# Patient Record
Sex: Male | Born: 1960 | Race: White | Hispanic: No | Marital: Married | Smoking: Current some day smoker
Health system: Southern US, Community
[De-identification: ages and names within clinical notes are randomized; demographics above are authoritative.]

## PROBLEM LIST (undated history)

## (undated) DIAGNOSIS — I2510 Atherosclerotic heart disease of native coronary artery without angina pectoris: Secondary | ICD-10-CM

## (undated) DIAGNOSIS — I10 Essential (primary) hypertension: Secondary | ICD-10-CM

## (undated) DIAGNOSIS — Z720 Tobacco use: Secondary | ICD-10-CM

## (undated) DIAGNOSIS — R001 Bradycardia, unspecified: Secondary | ICD-10-CM

## (undated) DIAGNOSIS — M549 Dorsalgia, unspecified: Secondary | ICD-10-CM

## (undated) DIAGNOSIS — I214 Non-ST elevation (NSTEMI) myocardial infarction: Secondary | ICD-10-CM

## (undated) DIAGNOSIS — E785 Hyperlipidemia, unspecified: Secondary | ICD-10-CM

## (undated) HISTORY — DX: Bradycardia, unspecified: R00.1

## (undated) HISTORY — DX: Tobacco use: Z72.0

## (undated) HISTORY — PX: PARTIAL GASTRECTOMY: SHX2172

## (undated) HISTORY — PX: BACK SURGERY: SHX140

## (undated) HISTORY — DX: Atherosclerotic heart disease of native coronary artery without angina pectoris: I25.10

---

## 2017-05-21 ENCOUNTER — Emergency Department (HOSPITAL_COMMUNITY): Payer: 59

## 2017-05-21 ENCOUNTER — Encounter (HOSPITAL_COMMUNITY): Payer: Self-pay | Admitting: Emergency Medicine

## 2017-05-21 ENCOUNTER — Inpatient Hospital Stay (HOSPITAL_COMMUNITY)
Admission: EM | Admit: 2017-05-21 | Discharge: 2017-05-22 | DRG: 247 | Disposition: A | Discharge status: Home / Self Care | Payer: 59 | Attending: Internal Medicine | Admitting: Internal Medicine

## 2017-05-21 ENCOUNTER — Encounter (HOSPITAL_COMMUNITY)
Admission: EM | Disposition: A | Discharge status: Home / Self Care | Payer: Self-pay | Source: Home / Self Care | Attending: Internal Medicine

## 2017-05-21 DIAGNOSIS — I2510 Atherosclerotic heart disease of native coronary artery without angina pectoris: Secondary | ICD-10-CM

## 2017-05-21 DIAGNOSIS — Z955 Presence of coronary angioplasty implant and graft: Secondary | ICD-10-CM

## 2017-05-21 DIAGNOSIS — F17210 Nicotine dependence, cigarettes, uncomplicated: Secondary | ICD-10-CM | POA: Diagnosis present

## 2017-05-21 DIAGNOSIS — Z720 Tobacco use: Secondary | ICD-10-CM | POA: Diagnosis not present

## 2017-05-21 DIAGNOSIS — I10 Essential (primary) hypertension: Secondary | ICD-10-CM

## 2017-05-21 DIAGNOSIS — I209 Angina pectoris, unspecified: Secondary | ICD-10-CM | POA: Diagnosis present

## 2017-05-21 DIAGNOSIS — E782 Mixed hyperlipidemia: Secondary | ICD-10-CM | POA: Diagnosis not present

## 2017-05-21 DIAGNOSIS — I214 Non-ST elevation (NSTEMI) myocardial infarction: Principal | ICD-10-CM

## 2017-05-21 DIAGNOSIS — E785 Hyperlipidemia, unspecified: Secondary | ICD-10-CM | POA: Diagnosis present

## 2017-05-21 DIAGNOSIS — M546 Pain in thoracic spine: Secondary | ICD-10-CM | POA: Diagnosis present

## 2017-05-21 HISTORY — DX: Non-ST elevation (NSTEMI) myocardial infarction: I21.4

## 2017-05-21 HISTORY — DX: Essential (primary) hypertension: I10

## 2017-05-21 HISTORY — PX: CORONARY BALLOON ANGIOPLASTY: CATH118233

## 2017-05-21 HISTORY — DX: Hyperlipidemia, unspecified: E78.5

## 2017-05-21 HISTORY — DX: Dorsalgia, unspecified: M54.9

## 2017-05-21 HISTORY — PX: LEFT HEART CATH AND CORONARY ANGIOGRAPHY: CATH118249

## 2017-05-21 HISTORY — PX: CORONARY STENT INTERVENTION: CATH118234

## 2017-05-21 LAB — TROPONIN I
TROPONIN I: 5.37 ng/mL — AB (ref <0.03)
Troponin I: 4.32 ng/mL (ref <0.03)

## 2017-05-21 LAB — BASIC METABOLIC PANEL
Anion gap: 9 (ref 5–15)
BUN: 8 mg/dL (ref 6–20)
CHLORIDE: 106 mmol/L (ref 101–111)
CO2: 23 mmol/L (ref 22–32)
Calcium: 9.2 mg/dL (ref 8.9–10.3)
Creatinine, Ser: 0.94 mg/dL (ref 0.61–1.24)
GFR calc non Af Amer: >60 mL/min (ref >60)
Glucose, Bld: 103 mg/dL — ABNORMAL HIGH (ref 65–99)
POTASSIUM: 3.3 mmol/L — AB (ref 3.5–5.1)
SODIUM: 138 mmol/L (ref 135–145)

## 2017-05-21 LAB — CBC
HEMATOCRIT: 41.3 % (ref 39.0–52.0)
Hemoglobin: 14.3 g/dL (ref 13.0–17.0)
MCH: 31.4 pg (ref 26.0–34.0)
MCHC: 34.6 g/dL (ref 30.0–36.0)
MCV: 90.8 fL (ref 78.0–100.0)
Platelets: 209 10*3/uL (ref 150–400)
RBC: 4.55 MIL/uL (ref 4.22–5.81)
RDW: 13.1 % (ref 11.5–15.5)
WBC: 10.3 10*3/uL (ref 4.0–10.5)

## 2017-05-21 LAB — I-STAT TROPONIN, ED: Troponin i, poc: 0.16 ng/mL (ref 0.00–0.08)

## 2017-05-21 LAB — TYPE AND SCREEN
ABO/RH(D): O NEG
Antibody Screen: NEGATIVE

## 2017-05-21 LAB — PROTIME-INR
INR: 0.95
PROTHROMBIN TIME: 12.6 s (ref 11.4–15.2)

## 2017-05-21 LAB — ABO/RH: ABO/RH(D): O NEG

## 2017-05-21 LAB — APTT: aPTT: 30 seconds (ref 24–36)

## 2017-05-21 LAB — HEPARIN LEVEL (UNFRACTIONATED): Heparin Unfractionated: 0.16 IU/mL — ABNORMAL LOW (ref 0.30–0.70)

## 2017-05-21 LAB — MRSA PCR SCREENING: MRSA BY PCR: NEGATIVE

## 2017-05-21 SURGERY — LEFT HEART CATH AND CORONARY ANGIOGRAPHY
Anesthesia: LOCAL

## 2017-05-21 MED ORDER — HEPARIN BOLUS VIA INFUSION
4000.0000 [IU] | Freq: Once | INTRAVENOUS | Status: AC
  Administered 2017-05-21: 4000 [IU] via INTRAVENOUS
  Filled 2017-05-21: qty 4000

## 2017-05-21 MED ORDER — IOPAMIDOL (ISOVUE-370) INJECTION 76%
INTRAVENOUS | Status: DC | PRN
  Administered 2017-05-21: 215 mL via INTRA_ARTERIAL

## 2017-05-21 MED ORDER — TIROFIBAN (AGGRASTAT) BOLUS VIA INFUSION
INTRAVENOUS | Status: DC | PRN
  Administered 2017-05-21: 2080 ug via INTRAVENOUS

## 2017-05-21 MED ORDER — TICAGRELOR 90 MG PO TABS
ORAL_TABLET | ORAL | Status: DC | PRN
  Administered 2017-05-21: 180 mg via ORAL

## 2017-05-21 MED ORDER — NITROGLYCERIN 1 MG/10 ML FOR IR/CATH LAB
INTRA_ARTERIAL | Status: DC | PRN
  Administered 2017-05-21: 400 ug via INTRA_ARTERIAL
  Administered 2017-05-21: 200 ug via INTRACORONARY

## 2017-05-21 MED ORDER — VERAPAMIL HCL 2.5 MG/ML IV SOLN
INTRAVENOUS | Status: AC
  Filled 2017-05-21: qty 2

## 2017-05-21 MED ORDER — SODIUM CHLORIDE 0.9 % WEIGHT BASED INFUSION: 3.0000 mL/kg/h | INTRAVENOUS | Status: DC

## 2017-05-21 MED ORDER — IOPAMIDOL (ISOVUE-370) INJECTION 76%
INTRAVENOUS | Status: AC
  Administered 2017-05-21: 100 mL
  Filled 2017-05-21: qty 100

## 2017-05-21 MED ORDER — LABETALOL HCL 5 MG/ML IV SOLN: 10.0000 mg | INTRAVENOUS | Status: AC | PRN

## 2017-05-21 MED ORDER — METOPROLOL TARTRATE 12.5 MG HALF TABLET
12.5000 mg | ORAL_TABLET | Freq: Two times a day (BID) | ORAL | Status: DC
  Administered 2017-05-21 – 2017-05-22 (×3): 12.5 mg via ORAL
  Filled 2017-05-21 (×3): qty 1

## 2017-05-21 MED ORDER — TICAGRELOR 90 MG PO TABS
90.0000 mg | ORAL_TABLET | Freq: Two times a day (BID) | ORAL | Status: DC
  Administered 2017-05-22 (×2): 90 mg via ORAL
  Filled 2017-05-21 (×2): qty 1

## 2017-05-21 MED ORDER — ATORVASTATIN CALCIUM 80 MG PO TABS
80.0000 mg | ORAL_TABLET | Freq: Every day | ORAL | Status: DC
  Administered 2017-05-21: 80 mg via ORAL
  Filled 2017-05-21: qty 1

## 2017-05-21 MED ORDER — ACETAMINOPHEN 325 MG PO TABS: 650.0000 mg | ORAL_TABLET | ORAL | Status: DC | PRN

## 2017-05-21 MED ORDER — SODIUM CHLORIDE 0.9% FLUSH: 3.0000 mL | Freq: Two times a day (BID) | INTRAVENOUS | Status: DC

## 2017-05-21 MED ORDER — ASPIRIN 81 MG PO CHEW
324.0000 mg | CHEWABLE_TABLET | Freq: Once | ORAL | Status: AC
  Administered 2017-05-21: 324 mg via ORAL
  Filled 2017-05-21: qty 4

## 2017-05-21 MED ORDER — HEPARIN SODIUM (PORCINE) 1000 UNIT/ML IJ SOLN
INTRAMUSCULAR | Status: DC | PRN
  Administered 2017-05-21: 7000 [IU] via INTRAVENOUS
  Administered 2017-05-21: 3000 [IU] via INTRAVENOUS

## 2017-05-21 MED ORDER — SODIUM CHLORIDE 0.9 % IV SOLN
INTRAVENOUS | Status: AC
  Administered 2017-05-21: 16:00:00 via INTRAVENOUS

## 2017-05-21 MED ORDER — TIROFIBAN HCL IN NACL 5-0.9 MG/100ML-% IV SOLN
INTRAVENOUS | Status: AC
  Filled 2017-05-21: qty 100

## 2017-05-21 MED ORDER — LIDOCAINE HCL (PF) 1 % IJ SOLN
INTRAMUSCULAR | Status: DC | PRN
  Administered 2017-05-21: 2 mL

## 2017-05-21 MED ORDER — IOPAMIDOL (ISOVUE-370) INJECTION 76%
INTRAVENOUS | Status: AC
  Filled 2017-05-21: qty 100

## 2017-05-21 MED ORDER — MIDAZOLAM HCL 2 MG/2ML IJ SOLN
INTRAMUSCULAR | Status: DC | PRN
  Administered 2017-05-21: 1 mg
  Administered 2017-05-21: 2 mg via INTRAVENOUS

## 2017-05-21 MED ORDER — SODIUM CHLORIDE 0.9 % IV SOLN: 250.0000 mL | INTRAVENOUS | Status: DC | PRN

## 2017-05-21 MED ORDER — SODIUM CHLORIDE 0.9% FLUSH: 3.0000 mL | INTRAVENOUS | Status: DC | PRN

## 2017-05-21 MED ORDER — IOPAMIDOL (ISOVUE-370) INJECTION 76%
INTRAVENOUS | Status: AC
  Filled 2017-05-21: qty 125

## 2017-05-21 MED ORDER — SODIUM CHLORIDE 0.9 % WEIGHT BASED INFUSION: 1.0000 mL/kg/h | INTRAVENOUS | Status: DC

## 2017-05-21 MED ORDER — MIDAZOLAM HCL 2 MG/2ML IJ SOLN
INTRAMUSCULAR | Status: AC
  Filled 2017-05-21: qty 2

## 2017-05-21 MED ORDER — IOPAMIDOL (ISOVUE-370) INJECTION 76%
INTRAVENOUS | Status: AC
  Filled 2017-05-21: qty 50

## 2017-05-21 MED ORDER — HEPARIN (PORCINE) IN NACL 2-0.9 UNIT/ML-% IJ SOLN
INTRAMUSCULAR | Status: AC | PRN
  Administered 2017-05-21: 1000 mL

## 2017-05-21 MED ORDER — FENTANYL CITRATE (PF) 100 MCG/2ML IJ SOLN
INTRAMUSCULAR | Status: AC
  Filled 2017-05-21: qty 2

## 2017-05-21 MED ORDER — HYDRALAZINE HCL 20 MG/ML IJ SOLN: 5.0000 mg | INTRAMUSCULAR | Status: AC | PRN

## 2017-05-21 MED ORDER — ASPIRIN 81 MG PO CHEW: 81.0000 mg | CHEWABLE_TABLET | ORAL | Status: DC

## 2017-05-21 MED ORDER — NITROGLYCERIN IN D5W 200-5 MCG/ML-% IV SOLN: 15.0000 ug/min | INTRAVENOUS | Status: DC

## 2017-05-21 MED ORDER — TIROFIBAN HCL IN NACL 5-0.9 MG/100ML-% IV SOLN: 0.1500 ug/kg/min | INTRAVENOUS | Status: DC

## 2017-05-21 MED ORDER — TIROFIBAN HCL IV 12.5 MG/250 ML
INTRAVENOUS | Status: DC | PRN
  Administered 2017-05-21: .15 ug/kg/min via INTRAVENOUS

## 2017-05-21 MED ORDER — ASPIRIN 81 MG PO CHEW
81.0000 mg | CHEWABLE_TABLET | Freq: Every day | ORAL | Status: DC
  Administered 2017-05-21 – 2017-05-22 (×2): 81 mg via ORAL
  Filled 2017-05-21 (×2): qty 1

## 2017-05-21 MED ORDER — ONDANSETRON HCL 4 MG/2ML IJ SOLN: 4.0000 mg | Freq: Four times a day (QID) | INTRAMUSCULAR | Status: DC | PRN

## 2017-05-21 MED ORDER — SODIUM CHLORIDE 0.9% FLUSH
3.0000 mL | Freq: Two times a day (BID) | INTRAVENOUS | Status: DC
  Administered 2017-05-22: 3 mL via INTRAVENOUS

## 2017-05-21 MED ORDER — FENTANYL CITRATE (PF) 100 MCG/2ML IJ SOLN
INTRAMUSCULAR | Status: DC | PRN
  Administered 2017-05-21: 25 ug via INTRAVENOUS

## 2017-05-21 MED ORDER — HEPARIN (PORCINE) IN NACL 100-0.45 UNIT/ML-% IJ SOLN
1000.0000 [IU]/h | INTRAMUSCULAR | Status: DC
  Administered 2017-05-21: 1000 [IU]/h via INTRAVENOUS
  Filled 2017-05-21: qty 250

## 2017-05-21 MED ORDER — NITROGLYCERIN IN D5W 200-5 MCG/ML-% IV SOLN
0.0000 ug/min | Freq: Once | INTRAVENOUS | Status: AC
  Administered 2017-05-21: 5 ug/min via INTRAVENOUS
  Filled 2017-05-21: qty 250

## 2017-05-21 MED ORDER — TICAGRELOR 90 MG PO TABS
ORAL_TABLET | ORAL | Status: AC
  Filled 2017-05-21: qty 2

## 2017-05-21 MED ORDER — HEPARIN SODIUM (PORCINE) 1000 UNIT/ML IJ SOLN
INTRAMUSCULAR | Status: AC
  Filled 2017-05-21: qty 1

## 2017-05-21 MED ORDER — ASPIRIN EC 81 MG PO TBEC: 81.0000 mg | DELAYED_RELEASE_TABLET | Freq: Every day | ORAL | Status: DC

## 2017-05-21 MED ORDER — NITROGLYCERIN 1 MG/10 ML FOR IR/CATH LAB
INTRA_ARTERIAL | Status: AC
  Filled 2017-05-21: qty 10

## 2017-05-21 MED ORDER — SODIUM CHLORIDE 0.9 % WEIGHT BASED INFUSION
3.0000 mL/kg/h | INTRAVENOUS | Status: DC
  Administered 2017-05-21: 3 mL/kg/h via INTRAVENOUS

## 2017-05-21 MED ORDER — NITROGLYCERIN 0.4 MG SL SUBL
0.4000 mg | SUBLINGUAL_TABLET | SUBLINGUAL | Status: AC | PRN
  Administered 2017-05-21 (×3): 0.4 mg via SUBLINGUAL
  Filled 2017-05-21 (×2): qty 1

## 2017-05-21 MED ORDER — VERAPAMIL HCL 2.5 MG/ML IV SOLN
INTRAVENOUS | Status: DC | PRN
  Administered 2017-05-21: 10 mL via INTRA_ARTERIAL

## 2017-05-21 SURGICAL SUPPLY — 21 items
BALLN EMERGE MR 2.5X20 (BALLOONS) ×2
BALLN ~~LOC~~ EMERGE MR 3.75X8 (BALLOONS) ×2
BALLOON EMERGE MR 2.5X20 (BALLOONS) ×1 IMPLANT
BALLOON ~~LOC~~ EMERGE MR 3.75X8 (BALLOONS) ×1 IMPLANT
CATH 5FR JL3.5 JR4 ANG PIG MP (CATHETERS) ×2 IMPLANT
CATH LAUNCHER 6FR EBU 3 (CATHETERS) ×2 IMPLANT
DEVICE RAD COMP TR BAND LRG (VASCULAR PRODUCTS) ×4 IMPLANT
ELECT DEFIB PAD ADLT CADENCE (PAD) ×2 IMPLANT
GLIDESHEATH SLEND SS 6F .021 (SHEATH) ×2 IMPLANT
GUIDEWIRE INQWIRE 1.5J.035X260 (WIRE) ×1 IMPLANT
INQWIRE 1.5J .035X260CM (WIRE) ×2
KIT ENCORE 26 ADVANTAGE (KITS) ×2 IMPLANT
KIT HEART LEFT (KITS) ×2 IMPLANT
PACK CARDIAC CATHETERIZATION (CUSTOM PROCEDURE TRAY) ×2 IMPLANT
STENT SYNERGY DES 3.5X16 (Permanent Stent) ×2 IMPLANT
STENT SYNERGY DES 3.5X20 (Permanent Stent) ×2 IMPLANT
TRANSDUCER W/STOPCOCK (MISCELLANEOUS) ×2 IMPLANT
TUBING CIL FLEX 10 FLL-RA (TUBING) ×2 IMPLANT
VALVE GUARDIAN II ~~LOC~~ HEMO (MISCELLANEOUS) ×2 IMPLANT
WIRE ASAHI PROWATER 180CM (WIRE) ×2 IMPLANT
WIRE HI TORQ BMW 190CM (WIRE) ×2 IMPLANT

## 2017-05-21 NOTE — ED Notes (Signed)
Patient transported to CT scan . 

## 2017-05-21 NOTE — Progress Notes (Addendum)
    7:53 am  Re-evaluated patient. He is resting comfortably and only having some mild chest soreness at this time after NTG drip up titrated  To 15 mcg. Plan to go get him into a room and cycle troponins. He will need cardiac catheterization either today or Monday. Initial troponin is 0.16 at 4 am.  -- Cycle troponins, daily weights, strict in/outs  EKG shows NSR, incomplete RBBB HR 75 No old for comparison.

## 2017-05-21 NOTE — ED Provider Notes (Signed)
[facility] DEPT Provider Note   CSN: [id number] Arrival date & time: 05/21/17  9892     History   Chief Complaint Chief Complaint  Patient presents with  . Chest Pain  . Back Pain    HPI Lucas Norton is a 56 y.o. male.  The history is provided by the patient and the spouse.  Chest Pain   This is a recurrent problem. The current episode started 3 to 5 hours ago. The problem occurs constantly. The problem has been rapidly worsening. The pain is severe. The quality of the pain is described as pressure-like. The pain radiates to the upper back. Associated symptoms include back pain, numbness and shortness of breath. Pertinent negatives include no fever. He has tried nothing for the symptoms. Risk factors include smoking/tobacco exposure and male gender.  His past medical history is significant for hyperlipidemia and hypertension.  Pertinent negatives for past medical history include no CAD and no PE.  Back Pain   Associated symptoms include chest pain and numbness. Pertinent negatives include no fever.  patient with h/o HTN/HLP presents with pain He reports about 3 hrs ago he had onset of upper back pain that radiates into chest  He report SOB He reports "tingling" in his left arm No HA No abd pain No focal weakness He reports similar pain before but this is worse, and no recent medical evaluations  PMH - HTN/HLP Soc hx - smoker, just moved here from [place]  Past Medical History:  Diagnosis Date  . Back pain   . Hypercholesteremia   . Hypertension     There are no active problems to display for this patient.   Past Surgical History:  Procedure Laterality Date  . BACK SURGERY    . PARTIAL GASTRECTOMY         Home Medications    Prior to Admission medications   Not on File    Family History History reviewed. No pertinent family history.  Social History Social History  Substance Use Topics  . Smoking status: Current Every Day Smoker   Packs/day: 1.00    Types: Cigarettes  . Smokeless tobacco: Not on file  . Alcohol use Yes     Comment: socially     Allergies   Patient has no known allergies.   Review of Systems Review of Systems  Constitutional: Negative for fever.  Respiratory: Positive for shortness of breath.   Cardiovascular: Positive for chest pain.  Musculoskeletal: Positive for back pain.  Neurological: Positive for numbness.  All other systems reviewed and are negative.    Physical Exam Updated Vital Signs BP (!) 146/101 (BP Location: Right Arm)   Pulse 73   Temp 98.1 F (36.7 C) (Oral)   Resp 20   Ht 1.778 m (5\' 10" )   Wt 86.2 kg (190 lb)   SpO2 98%   BMI 27.26 kg/m   Physical Exam CONSTITUTIONAL: Well developed/well nourished, anxious, pacing around room HEAD: Normocephalic/atraumatic EYES: EOMI/PERRL ENMT: Mucous membranes moist NECK: supple no meningeal signs SPINE/BACK:entire spine nontender CV: S1/S2 noted, no murmurs/rubs/gallops noted LUNGS: Lungs are clear to auscultation bilaterally, no apparent distress ABDOMEN: soft, nontender, no rebound or guarding, bowel sounds noted throughout abdomen GU:no cva tenderness NEURO: Pt is awake/alert/appropriate, moves all extremitiesx4.  No facial droop.  No arm/leg drift noted EXTREMITIES: pulses normal/equal, full ROM SKIN: warm, color normal PSYCH: anxious  ED Treatments / Results  Labs (all labs ordered are listed, but only abnormal results are displayed) Labs Reviewed  BASIC  METABOLIC PANEL - Abnormal; Notable for the following:       Result Value   Potassium 3.3 (*)    Glucose, Bld 103 (*)    All other components within normal limits  I-STAT TROPONIN, ED - Abnormal; Notable for the following:    Troponin i, poc 0.16 (*)    All other components within normal limits  CBC  PROTIME-INR  APTT  HEPARIN LEVEL (UNFRACTIONATED)  TYPE AND SCREEN  ABO/RH    EKG  EKG Interpretation  Date/Time:  Sunday May 21 2017 03:30:25  EDT Ventricular Rate:  80 PR Interval:  152 QRS Duration: 112 QT Interval:  376 QTC Calculation: 433 R Axis:   94 Text Interpretation:  Normal sinus rhythm Rightward axis Incomplete right bundle branch block Cannot rule out Inferior infarct , age undetermined Abnormal ECG No previous ECGs available Confirmed by Ripley Fraise [phone]) on 05/21/2017 4:14:06 AM       Radiology Dg Chest 2 View  Result Date: 05/21/2017 CLINICAL DATA:  Chest and back pain for 3 hours. Hypertension. Smoker. EXAM: CHEST  2 VIEW COMPARISON:  None. FINDINGS: Normal heart size and pulmonary vascularity. Mediastinal contours are intact. Calcified and tortuous aorta. Vague rounded density over the right upper chest probably represents an EKG lead. No airspace disease or consolidation in the lungs. Calcified granulomas in the right lung. No blunting of costophrenic angles. No pneumothorax. Mild degenerative changes in the spine. IMPRESSION: No active cardiopulmonary disease. Electronically Signed   By: Lucienne Capers M.D.   On: 05/21/2017 04:00   Ct Angio Chest/abd/pel For Dissection W And/or Wo Contrast  Result Date: 05/21/2017 CLINICAL DATA:  Midchest pain radiating to the back for 3 hours. EXAM: CT ANGIOGRAPHY CHEST, ABDOMEN AND PELVIS TECHNIQUE: Multidetector CT imaging through the chest, abdomen and pelvis was performed using the standard protocol during bolus administration of intravenous contrast. Multiplanar reconstructed images and MIPs were obtained and reviewed to evaluate the vascular anatomy. CONTRAST:  100 mL Isovue 370 COMPARISON:  None. FINDINGS: CTA CHEST FINDINGS Cardiovascular: Preferential opacification of the thoracic aorta. No evidence of thoracic aortic aneurysm or dissection. Normal heart size. No pericardial effusion. The pulmonary arteries are also well opacified, with no emboli. Mediastinum/Nodes: No enlarged mediastinal, hilar, or axillary lymph nodes. Thyroid gland, trachea, and esophagus  demonstrate no significant findings. Lungs/Pleura: Lungs are clear. No pleural effusion or pneumothorax. Incidental calcified granulomata. Musculoskeletal: No significant skeletal lesions. Review of the MIP images confirms the above findings. CTA ABDOMEN AND PELVIS FINDINGS VASCULAR Aorta: Normal caliber aorta without aneurysm, dissection, vasculitis or significant stenosis. Moderate atherosclerotic calcifications and nonstenotic plaque. Celiac: Patent without evidence of aneurysm, dissection, vasculitis or significant stenosis. SMA: Patent without evidence of aneurysm, dissection, vasculitis or significant stenosis. Renals: Both renal arteries are patent without evidence of aneurysm, dissection, vasculitis, fibromuscular dysplasia or significant stenosis. IMA: Patent without evidence of aneurysm, dissection, vasculitis or significant stenosis. Inflow: Patent without evidence of aneurysm, dissection, vasculitis or significant stenosis. Veins: No obvious venous abnormality within the limitations of this arterial phase study. Review of the MIP images confirms the above findings. NON-VASCULAR Hepatobiliary: No focal liver abnormality is seen. No gallstones, gallbladder wall thickening, or biliary dilatation. Pancreas: Unremarkable. No pancreatic ductal dilatation or surrounding inflammatory changes. Spleen: Normal in size without focal abnormality. Adrenals/Urinary Tract: Adrenal glands are unremarkable. Kidneys are normal, without renal calculi, focal lesion, or hydronephrosis. Bladder is unremarkable. Stomach/Bowel: Stomach has been partially resected, probably with gastrojejunostomy. Small bowel is otherwise unremarkable. Appendix is normal. No  evidence of bowel wall thickening, distention, or inflammatory changes. Lymphatic: No adenopathy in the abdomen or pelvis. Reproductive: Unremarkable Other: No focal inflammation.  No ascites. Musculoskeletal: No significant skeletal lesion. Moderately severe lumbar  degenerative disc changes at L3-4 and L4-5. Review of the MIP images confirms the above findings. IMPRESSION: 1. Aortic atherosclerosis.  No evidence of acute aortic syndrome. 2. No acute findings are evident in the chest, abdomen or pelvis. Electronically Signed   By: Andreas Newport M.D.   On: 05/21/2017 05:15    Procedures Procedures  CRITICAL CARE Performed by: Sharyon Cable Total critical care time: 40 minutes Critical care time was exclusive of separately billable procedures and treating other patients. Critical care was necessary to treat or prevent imminent or life-threatening deterioration. Critical care was time spent personally by me on the following activities: development of treatment plan with patient and/or surrogate as well as nursing, discussions with consultants, evaluation of patient's response to treatment, examination of patient, obtaining history from patient or surrogate, ordering and performing treatments and interventions, ordering and review of laboratory studies, ordering and review of radiographic studies, pulse oximetry and re-evaluation of patient's condition. PATIENT WITH NON-STEMI, REQUIRING IV NITRO AND IV HEPARIN DRIP AND ADMISSION  Medications Ordered in ED Medications  heparin ADULT infusion 100 units/mL (25000 units/266mL sodium chloride 0.45%) (1,000 Units/hr Intravenous New Bag/Given 05/21/17 0623)  nitroGLYCERIN (NITROSTAT) SL tablet 0.4 mg (0.4 mg Sublingual Given 05/21/17 0447)  iopamidol (ISOVUE-370) 76 % injection (100 mLs  Contrast Given 05/21/17 0451)  aspirin chewable tablet 324 mg (324 mg Oral Given 05/21/17 0611)  heparin bolus via infusion 4,000 Units (4,000 Units Intravenous Bolus from Bag 05/21/17 0623)  nitroGLYCERIN 50 mg in dextrose 5 % 250 mL (0.2 mg/mL) infusion (15 mcg/min Intravenous Rate/Dose Change 05/21/17 0642)     Initial Impression / Assessment and Plan / ED Course  I have reviewed the triage vital signs and the nursing  notes.  Pertinent labs & imaging results that were available during my care of the patient were reviewed by me and considered in my medical decision making (see chart for details).     4:25 AM Pt is ill appearing, pacing around room With back/chest pain and HTN, concern for aortic dissection ACS is a concern but will need to r/o dissection first Meds/imaging ordered Will follow closely 5:27 AM CT scan negative for dissection Pt reports CP improved with NTG He appears improved Suspect ACS/non-stemi Will consult cardiology 6:44 AM D/W ON CALL CARDIOLOGY FELLOW WILL ADMIT PATIENT  Final Clinical Impressions(s) / ED Diagnoses   Final diagnoses:  Non-STEMI (non-ST elevated myocardial infarction) [facility])    New Prescriptions New Prescriptions   No medications on file     Ripley Fraise, MD 05/21/17 [phone]

## 2017-05-21 NOTE — H&P (Signed)
History and Physical  Primary Cardiologist:N/A PCP: Patient, No Pcp Per  Chief Complaint: Chest Pain, elevated troponin  HPI:  56 year old man with tobacco abuse, HTN, HLD no known CAD presenting with escalating chest and back pain progressive for 1 month.  He moved from [place] with his wife 1 month ago, not followed here.  At that time, he would have some mild morning chest pain that would get better after 1 hour.  It has slowly escalated to the point of being constant yesterday prompting presentation.  CTA c/a/p negative for dissection and no obvious PE.   In the ED, some relief with NTG but still with active moderate symptoms of chest pain 7/10.  He doesn't know his home meds well; but he is probably on Amlodipine, one more BP medication and a statin.  Does not take ASA.   No bleeding/bruising issues or other concerns.  No edema.  No fevers or hemoptysis or calf swelling or pain.  Prior Cardiac Studies:  N/A  Past Medical History:  Diagnosis Date  . Back pain   . HLD (hyperlipidemia)   . Hypercholesteremia   . Hypertension     Past Surgical History:  Procedure Laterality Date  . BACK SURGERY    . PARTIAL GASTRECTOMY      History reviewed. No pertinent family history. No mention of early family history of CAD Social History:  reports that he has been smoking Cigarettes.  He has been smoking about 1.00 pack per day. He does not have any smokeless tobacco history on file. He reports that he drinks alcohol. He reports that he does not use drugs.  Allergies: No Known Allergies  No current facility-administered medications on file prior to encounter.    No current outpatient prescriptions on file prior to encounter.   Results for orders placed or performed during the hospital encounter of 05/21/17 (from the past 48 hour(s))  Basic metabolic panel     Status: Abnormal   Collection Time: 05/21/17  3:38 AM  Result Value Ref Range   Sodium 138 135 - 145 mmol/L   Potassium 3.3 (L) 3.5 - 5.1 mmol/L   Chloride 106 101 - 111 mmol/L   CO2 23 22 - 32 mmol/L   Glucose, Bld 103 (H) 65 - 99 mg/dL   BUN 8 6 - 20 mg/dL   Creatinine, Ser 0.94 0.61 - 1.24 mg/dL   Calcium 9.2 8.9 - 10.3 mg/dL   GFR calc non Af Amer >60 >60 mL/min   GFR calc Af Amer >60 >60 mL/min    Comment: (NOTE) The eGFR has been calculated using the CKD EPI equation. This calculation has not been validated in all clinical situations. eGFR's persistently <60 mL/min signify possible Chronic Kidney Disease.    Anion gap 9 5 - 15  CBC     Status: None   Collection Time: 05/21/17  3:38 AM  Result Value Ref Range   WBC 10.3 4.0 - 10.5 K/uL   RBC 4.55 4.22 - 5.81 MIL/uL   Hemoglobin 14.3 13.0 - 17.0 g/dL   HCT 41.3 39.0 - 52.0 %   MCV 90.8 78.0 - 100.0 fL   MCH 31.4 26.0 - 34.0 pg   MCHC 34.6 30.0 - 36.0 g/dL   RDW 13.1 11.5 - 15.5 %   Platelets 209 150 - 400 K/uL  I-stat troponin, ED     Status: Abnormal   Collection Time: 05/21/17  3:59 AM  Result Value Ref Range   Troponin  i, poc 0.16 (HH) 0.00 - 0.08 ng/mL   Comment NOTIFIED PHYSICIAN    Comment 3            Comment: Due to the release kinetics of cTnI, a negative result within the first hours of the onset of symptoms does not rule out myocardial infarction with certainty. If myocardial infarction is still suspected, repeat the test at appropriate intervals.   Protime-INR     Status: None   Collection Time: 05/21/17  4:23 AM  Result Value Ref Range   Prothrombin Time 12.6 11.4 - 15.2 seconds   INR 0.95   APTT     Status: None   Collection Time: 05/21/17  4:23 AM  Result Value Ref Range   aPTT 30 24 - 36 seconds  Type and screen     Status: None   Collection Time: 05/21/17  4:42 AM  Result Value Ref Range   ABO/RH(D) O NEG    Antibody Screen NEG    Sample Expiration 05/24/2017   ABO/Rh     Status: None (Preliminary result)   Collection Time: 05/21/17  4:42 AM  Result Value Ref Range   ABO/RH(D) O NEG    Dg  Chest 2 View  Result Date: 05/21/2017 CLINICAL DATA:  Chest and back pain for 3 hours. Hypertension. Smoker. EXAM: CHEST  2 VIEW COMPARISON:  None. FINDINGS: Normal heart size and pulmonary vascularity. Mediastinal contours are intact. Calcified and tortuous aorta. Vague rounded density over the right upper chest probably represents an EKG lead. No airspace disease or consolidation in the lungs. Calcified granulomas in the right lung. No blunting of costophrenic angles. No pneumothorax. Mild degenerative changes in the spine. IMPRESSION: No active cardiopulmonary disease. Electronically Signed   By: Lucienne Capers M.D.   On: 05/21/2017 04:00   Ct Angio Chest/abd/pel For Dissection W And/or Wo Contrast  Result Date: 05/21/2017 CLINICAL DATA:  Midchest pain radiating to the back for 3 hours. EXAM: CT ANGIOGRAPHY CHEST, ABDOMEN AND PELVIS TECHNIQUE: Multidetector CT imaging through the chest, abdomen and pelvis was performed using the standard protocol during bolus administration of intravenous contrast. Multiplanar reconstructed images and MIPs were obtained and reviewed to evaluate the vascular anatomy. CONTRAST:  100 mL Isovue 370 COMPARISON:  None. FINDINGS: CTA CHEST FINDINGS Cardiovascular: Preferential opacification of the thoracic aorta. No evidence of thoracic aortic aneurysm or dissection. Normal heart size. No pericardial effusion. The pulmonary arteries are also well opacified, with no emboli. Mediastinum/Nodes: No enlarged mediastinal, hilar, or axillary lymph nodes. Thyroid gland, trachea, and esophagus demonstrate no significant findings. Lungs/Pleura: Lungs are clear. No pleural effusion or pneumothorax. Incidental calcified granulomata. Musculoskeletal: No significant skeletal lesions. Review of the MIP images confirms the above findings. CTA ABDOMEN AND PELVIS FINDINGS VASCULAR Aorta: Normal caliber aorta without aneurysm, dissection, vasculitis or significant stenosis. Moderate  atherosclerotic calcifications and nonstenotic plaque. Celiac: Patent without evidence of aneurysm, dissection, vasculitis or significant stenosis. SMA: Patent without evidence of aneurysm, dissection, vasculitis or significant stenosis. Renals: Both renal arteries are patent without evidence of aneurysm, dissection, vasculitis, fibromuscular dysplasia or significant stenosis. IMA: Patent without evidence of aneurysm, dissection, vasculitis or significant stenosis. Inflow: Patent without evidence of aneurysm, dissection, vasculitis or significant stenosis. Veins: No obvious venous abnormality within the limitations of this arterial phase study. Review of the MIP images confirms the above findings. NON-VASCULAR Hepatobiliary: No focal liver abnormality is seen. No gallstones, gallbladder wall thickening, or biliary dilatation. Pancreas: Unremarkable. No pancreatic ductal dilatation or surrounding inflammatory changes.  Spleen: Normal in size without focal abnormality. Adrenals/Urinary Tract: Adrenal glands are unremarkable. Kidneys are normal, without renal calculi, focal lesion, or hydronephrosis. Bladder is unremarkable. Stomach/Bowel: Stomach has been partially resected, probably with gastrojejunostomy. Small bowel is otherwise unremarkable. Appendix is normal. No evidence of bowel wall thickening, distention, or inflammatory changes. Lymphatic: No adenopathy in the abdomen or pelvis. Reproductive: Unremarkable Other: No focal inflammation.  No ascites. Musculoskeletal: No significant skeletal lesion. Moderately severe lumbar degenerative disc changes at L3-4 and L4-5. Review of the MIP images confirms the above findings. IMPRESSION: 1. Aortic atherosclerosis.  No evidence of acute aortic syndrome. 2. No acute findings are evident in the chest, abdomen or pelvis. Electronically Signed   By: Andreas Newport M.D.   On: 05/21/2017 05:15    ECG/Tele: NSR with RBBB and possible S1Q3T3 and inferior ST nonspecific  changes  ROS: As above. Otherwise, review of systems is negative unless per above HPI  Vitals:   05/21/17 0430 05/21/17 0442 05/21/17 0500 05/21/17 0530  BP: (!) 151/105 (!) 132/105 (!) 132/98 (!) 142/106  Pulse:  70  67  Resp: (!) 21 (!) 24    Temp:      TempSrc:      SpO2:  99% 100% 98%  Weight:      Height:       Wt Readings from Last 10 Encounters:  05/21/17 86.2 kg (190 lb)    PE:  General: Mild distress HEENT: Atraumatic, EOMI, mucous membranes moist. No JVD at 45 degrees. No HJR. CV: RRR no murmurs, gallops.  Respiratory: Clear, no crackles. Normal work of breathing ABD: Non-distended and non-tender. No palpable organomegaly.  Extremities: 2+ radial pulses bilaterally. no edema. Neuro/Psych: CN grossly intact, alert and oriented  Assessment/Plan NSTEMI Back/Chest Pain HTN HLD Tobacco Abuse  Although PE is possible, probably less likely by review of CT scan with normal RV/LV ratio and no obvious filling defect in proximal pulmonary arteries. Either way, he is being covered with heparin.  Given no dissection on CT as well, coronary disease is probably most likely.  He is having active angina, and I just increased his NTG to 15 mcg, should this not improve he will likely need an early invasive strategy to assess.  NSTEMI - S/P 325 ASA, IV UF Heparin - TTE ordered - Tele bed - NPO  - Started metoprolol 12.5 mg BID - Started Atorvastatin 80 mg  HTN - Held home amlodipine while on NTG IV; will need pharmacy med rec to determine if other meds  HLD - Atorva 80  Tobacco Abuse - Cessation counseling   Lolita Cram Bartolo Montanye  MD 05/21/2017, 6:51 AM

## 2017-05-21 NOTE — Progress Notes (Signed)
Progress Note  Patient Name: Lucas Norton Date of Encounter: 05/21/2017  Primary Cardiologist: New/Delron Comer  Subjective   No pain tired from being in ER all night Just moved from [place] for work only been here a week  Inpatient Medications    Scheduled Meds: . [START ON 05/22/2017] aspirin EC  81 mg Oral Daily  . atorvastatin  80 mg Oral q1800  . metoprolol tartrate  12.5 mg Oral BID   Continuous Infusions: . heparin 1,000 Units/hr (05/21/17 0833)  . nitroGLYCERIN 25 mcg/min (05/21/17 0833)   PRN Meds: acetaminophen, ondansetron (ZOFRAN) IV   Vital Signs    Vitals:   05/21/17 0700 05/21/17 0730 05/21/17 0800 05/21/17 0831  BP: (!) 123/94 127/89 (!) 140/95 125/84  Pulse: 66 65 67 77  Resp:      Temp:    (!) 97.5 F (36.4 C)  TempSrc:    Oral  SpO2: 96% 95% 95% 99%  Weight:    183 lb 6 oz (83.2 kg)  Height:    5\' 10"  (1.778 m)   No intake or output data in the 24 hours ending 05/21/17 0944 Filed Weights   05/21/17 0334 05/21/17 0831  Weight: 190 lb (86.2 kg) 183 lb 6 oz (83.2 kg)    Telemetry    NSR 05/21/2017  - Personally Reviewed  ECG    NSR no acute ST changes  - Personally Reviewed  Physical Exam  Nicotine on breath GEN: No acute distress.   Neck: No JVD Cardiac: RRR, no murmurs, rubs, or gallops.  Respiratory: Clear to auscultation bilaterally. GI: Soft, nontender, non-distended  MS: No edema; No deformity. Neuro:  Nonfocal  Psych: Normal affect   Labs    Chemistry Recent Labs Lab 05/21/17 0338  NA 138  K 3.3*  CL 106  CO2 23  GLUCOSE 103*  BUN 8  CREATININE 0.94  CALCIUM 9.2  GFRNONAA >60  GFRAA >60  ANIONGAP 9     Hematology Recent Labs Lab 05/21/17 0338  WBC 10.3  RBC 4.55  HGB 14.3  HCT 41.3  MCV 90.8  MCH 31.4  MCHC 34.6  RDW 13.1  PLT 209    Cardiac EnzymesNo results for input(s): TROPONINI in the last 168 hours.  Recent Labs Lab 05/21/17 0359  TROPIPOC 0.16*     BNPNo results for input(s): BNP, PROBNP  in the last 168 hours.   DDimer No results for input(s): DDIMER in the last 168 hours.   Radiology    Dg Chest 2 View  Result Date: 05/21/2017 CLINICAL DATA:  Chest and back pain for 3 hours. Hypertension. Smoker. EXAM: CHEST  2 VIEW COMPARISON:  None. FINDINGS: Normal heart size and pulmonary vascularity. Mediastinal contours are intact. Calcified and tortuous aorta. Vague rounded density over the right upper chest probably represents an EKG lead. No airspace disease or consolidation in the lungs. Calcified granulomas in the right lung. No blunting of costophrenic angles. No pneumothorax. Mild degenerative changes in the spine. IMPRESSION: No active cardiopulmonary disease. Electronically Signed   By: Lucienne Capers M.D.   On: 05/21/2017 04:00   Ct Angio Chest/abd/pel For Dissection W And/or Wo Contrast  Result Date: 05/21/2017 CLINICAL DATA:  Midchest pain radiating to the back for 3 hours. EXAM: CT ANGIOGRAPHY CHEST, ABDOMEN AND PELVIS TECHNIQUE: Multidetector CT imaging through the chest, abdomen and pelvis was performed using the standard protocol during bolus administration of intravenous contrast. Multiplanar reconstructed images and MIPs were obtained and reviewed to evaluate the vascular anatomy.  CONTRAST:  100 mL Isovue 370 COMPARISON:  None. FINDINGS: CTA CHEST FINDINGS Cardiovascular: Preferential opacification of the thoracic aorta. No evidence of thoracic aortic aneurysm or dissection. Normal heart size. No pericardial effusion. The pulmonary arteries are also well opacified, with no emboli. Mediastinum/Nodes: No enlarged mediastinal, hilar, or axillary lymph nodes. Thyroid gland, trachea, and esophagus demonstrate no significant findings. Lungs/Pleura: Lungs are clear. No pleural effusion or pneumothorax. Incidental calcified granulomata. Musculoskeletal: No significant skeletal lesions. Review of the MIP images confirms the above findings. CTA ABDOMEN AND PELVIS FINDINGS VASCULAR Aorta:  Normal caliber aorta without aneurysm, dissection, vasculitis or significant stenosis. Moderate atherosclerotic calcifications and nonstenotic plaque. Celiac: Patent without evidence of aneurysm, dissection, vasculitis or significant stenosis. SMA: Patent without evidence of aneurysm, dissection, vasculitis or significant stenosis. Renals: Both renal arteries are patent without evidence of aneurysm, dissection, vasculitis, fibromuscular dysplasia or significant stenosis. IMA: Patent without evidence of aneurysm, dissection, vasculitis or significant stenosis. Inflow: Patent without evidence of aneurysm, dissection, vasculitis or significant stenosis. Veins: No obvious venous abnormality within the limitations of this arterial phase study. Review of the MIP images confirms the above findings. NON-VASCULAR Hepatobiliary: No focal liver abnormality is seen. No gallstones, gallbladder wall thickening, or biliary dilatation. Pancreas: Unremarkable. No pancreatic ductal dilatation or surrounding inflammatory changes. Spleen: Normal in size without focal abnormality. Adrenals/Urinary Tract: Adrenal glands are unremarkable. Kidneys are normal, without renal calculi, focal lesion, or hydronephrosis. Bladder is unremarkable. Stomach/Bowel: Stomach has been partially resected, probably with gastrojejunostomy. Small bowel is otherwise unremarkable. Appendix is normal. No evidence of bowel wall thickening, distention, or inflammatory changes. Lymphatic: No adenopathy in the abdomen or pelvis. Reproductive: Unremarkable Other: No focal inflammation.  No ascites. Musculoskeletal: No significant skeletal lesion. Moderately severe lumbar degenerative disc changes at L3-4 and L4-5. Review of the MIP images confirms the above findings. IMPRESSION: 1. Aortic atherosclerosis.  No evidence of acute aortic syndrome. 2. No acute findings are evident in the chest, abdomen or pelvis. Electronically Signed   By: Andreas Newport M.D.   On:  05/21/2017 05:15    Cardiac Studies   None  Patient Profile     56 y.o. male with SSCP mildly elevated troponin No acute ECG changes  CT no PE, effusion Or dissection   Assessment & Plan    1) Chest pain: mildly elevated troponin smoker discussed cath in am with patient and risks including Stroke willing to proceed good right radial pulse orders written placed on board continue heparin and nitro 2) Smoking discussed cessation and risk of vascular disease and cancer CT with no lesions 3) Chol:  On statin   Signed, Jenkins Rouge, MD  05/21/2017, 9:44 AM

## 2017-05-21 NOTE — Progress Notes (Signed)
Cardilogist PA Meng notified of pt's pain increasing and radiating. Per pt pain is now a 6/10 from a 3/10 in his back described as pressure that radiates to his lt chest & arm. Pt's nitro drip was increased to 30 & then 35 mcg. Pt's VS stable & charted. EKG in the chart.   New orders for pt to go to the cath lab. Pt & wife both watched the cath video earlier today around 0900. All questions were answered & consent was signed. Pt's wife was notified of pt going down for the procedure. Hoover Brunette, RN

## 2017-05-21 NOTE — Progress Notes (Signed)
Dr. Irish Lack paged. Updated of pt condition. Clarified nitroglycerin gtt that was still infusing upon arrival to unit without active order. Per MD, discontinue gtt at this time. Clarified scheduled dose of Aggrastat and current diet order. New orders received.

## 2017-05-21 NOTE — Progress Notes (Signed)
ANTICOAGULATION CONSULT NOTE - Initial Consult  Pharmacy Consult for heparin Indication: chest pain/ACS  No Known Allergies  Patient Measurements: Height: 5\' 10"  (177.8 cm) Weight: 190 lb (86.2 kg) IBW/kg (Calculated) : 73  Vital Signs: Temp: 98.1 F (36.7 C) (07/29 0338) Temp Source: Oral (07/29 0338) BP: 132/98 (07/29 0500) Pulse Rate: 70 (07/29 0442)  Labs:  Recent Labs  05/21/17 0338 05/21/17 0423  HGB 14.3  --   HCT 41.3  --   PLT 209  --   APTT  --  30  LABPROT  --  12.6  INR  --  0.95  CREATININE 0.94  --     Estimated Creatinine Clearance: 90.6 mL/min (by C-G formula based on SCr of 0.94 mg/dL).   Medical History: Past Medical History:  Diagnosis Date  . Back pain   . Hypercholesteremia   . Hypertension     Assessment: 56yo male c/o back pain that radiates to chest and associated w/ nausea, CXR and CT negative, initial istat troponin elevated, to begin heparin.  Goal of Therapy:  Heparin level 0.3-0.7 units/ml   Plan:  Will give heparin 4000 units IV bolus x1 followed by gtt at 1000 units/hr and monitor heparin levels and CBC.  Wynona Neat, PharmD, BCPS  05/21/2017,5:28 AM

## 2017-05-21 NOTE — H&P (View-Only) (Signed)
Progress Note  Patient Name: Lucas Norton Date of Encounter: 05/21/2017  Primary Cardiologist: New/Zebulon Gantt  Subjective   No pain tired from being in ER all night Just moved from [place] for work only been here a week  Inpatient Medications    Scheduled Meds: . [START ON 05/22/2017] aspirin EC  81 mg Oral Daily  . atorvastatin  80 mg Oral q1800  . metoprolol tartrate  12.5 mg Oral BID   Continuous Infusions: . heparin 1,000 Units/hr (05/21/17 0833)  . nitroGLYCERIN 25 mcg/min (05/21/17 0833)   PRN Meds: acetaminophen, ondansetron (ZOFRAN) IV   Vital Signs    Vitals:   05/21/17 0700 05/21/17 0730 05/21/17 0800 05/21/17 0831  BP: (!) 123/94 127/89 (!) 140/95 125/84  Pulse: 66 65 67 77  Resp:      Temp:    (!) 97.5 F (36.4 C)  TempSrc:    Oral  SpO2: 96% 95% 95% 99%  Weight:    183 lb 6 oz (83.2 kg)  Height:    5\' 10"  (1.778 m)   No intake or output data in the 24 hours ending 05/21/17 0944 Filed Weights   05/21/17 0334 05/21/17 0831  Weight: 190 lb (86.2 kg) 183 lb 6 oz (83.2 kg)    Telemetry    NSR 05/21/2017  - Personally Reviewed  ECG    NSR no acute ST changes  - Personally Reviewed  Physical Exam  Nicotine on breath GEN: No acute distress.   Neck: No JVD Cardiac: RRR, no murmurs, rubs, or gallops.  Respiratory: Clear to auscultation bilaterally. GI: Soft, nontender, non-distended  MS: No edema; No deformity. Neuro:  Nonfocal  Psych: Normal affect   Labs    Chemistry Recent Labs Lab 05/21/17 0338  NA 138  K 3.3*  CL 106  CO2 23  GLUCOSE 103*  BUN 8  CREATININE 0.94  CALCIUM 9.2  GFRNONAA >60  GFRAA >60  ANIONGAP 9     Hematology Recent Labs Lab 05/21/17 0338  WBC 10.3  RBC 4.55  HGB 14.3  HCT 41.3  MCV 90.8  MCH 31.4  MCHC 34.6  RDW 13.1  PLT 209    Cardiac EnzymesNo results for input(s): TROPONINI in the last 168 hours.  Recent Labs Lab 05/21/17 0359  TROPIPOC 0.16*     BNPNo results for input(s): BNP, PROBNP  in the last 168 hours.   DDimer No results for input(s): DDIMER in the last 168 hours.   Radiology    Dg Chest 2 View  Result Date: 05/21/2017 CLINICAL DATA:  Chest and back pain for 3 hours. Hypertension. Smoker. EXAM: CHEST  2 VIEW COMPARISON:  None. FINDINGS: Normal heart size and pulmonary vascularity. Mediastinal contours are intact. Calcified and tortuous aorta. Vague rounded density over the right upper chest probably represents an EKG lead. No airspace disease or consolidation in the lungs. Calcified granulomas in the right lung. No blunting of costophrenic angles. No pneumothorax. Mild degenerative changes in the spine. IMPRESSION: No active cardiopulmonary disease. Electronically Signed   By: Lucienne Capers M.D.   On: 05/21/2017 04:00   Ct Angio Chest/abd/pel For Dissection W And/or Wo Contrast  Result Date: 05/21/2017 CLINICAL DATA:  Midchest pain radiating to the back for 3 hours. EXAM: CT ANGIOGRAPHY CHEST, ABDOMEN AND PELVIS TECHNIQUE: Multidetector CT imaging through the chest, abdomen and pelvis was performed using the standard protocol during bolus administration of intravenous contrast. Multiplanar reconstructed images and MIPs were obtained and reviewed to evaluate the vascular anatomy.  CONTRAST:  100 mL Isovue 370 COMPARISON:  None. FINDINGS: CTA CHEST FINDINGS Cardiovascular: Preferential opacification of the thoracic aorta. No evidence of thoracic aortic aneurysm or dissection. Normal heart size. No pericardial effusion. The pulmonary arteries are also well opacified, with no emboli. Mediastinum/Nodes: No enlarged mediastinal, hilar, or axillary lymph nodes. Thyroid gland, trachea, and esophagus demonstrate no significant findings. Lungs/Pleura: Lungs are clear. No pleural effusion or pneumothorax. Incidental calcified granulomata. Musculoskeletal: No significant skeletal lesions. Review of the MIP images confirms the above findings. CTA ABDOMEN AND PELVIS FINDINGS VASCULAR Aorta:  Normal caliber aorta without aneurysm, dissection, vasculitis or significant stenosis. Moderate atherosclerotic calcifications and nonstenotic plaque. Celiac: Patent without evidence of aneurysm, dissection, vasculitis or significant stenosis. SMA: Patent without evidence of aneurysm, dissection, vasculitis or significant stenosis. Renals: Both renal arteries are patent without evidence of aneurysm, dissection, vasculitis, fibromuscular dysplasia or significant stenosis. IMA: Patent without evidence of aneurysm, dissection, vasculitis or significant stenosis. Inflow: Patent without evidence of aneurysm, dissection, vasculitis or significant stenosis. Veins: No obvious venous abnormality within the limitations of this arterial phase study. Review of the MIP images confirms the above findings. NON-VASCULAR Hepatobiliary: No focal liver abnormality is seen. No gallstones, gallbladder wall thickening, or biliary dilatation. Pancreas: Unremarkable. No pancreatic ductal dilatation or surrounding inflammatory changes. Spleen: Normal in size without focal abnormality. Adrenals/Urinary Tract: Adrenal glands are unremarkable. Kidneys are normal, without renal calculi, focal lesion, or hydronephrosis. Bladder is unremarkable. Stomach/Bowel: Stomach has been partially resected, probably with gastrojejunostomy. Small bowel is otherwise unremarkable. Appendix is normal. No evidence of bowel wall thickening, distention, or inflammatory changes. Lymphatic: No adenopathy in the abdomen or pelvis. Reproductive: Unremarkable Other: No focal inflammation.  No ascites. Musculoskeletal: No significant skeletal lesion. Moderately severe lumbar degenerative disc changes at L3-4 and L4-5. Review of the MIP images confirms the above findings. IMPRESSION: 1. Aortic atherosclerosis.  No evidence of acute aortic syndrome. 2. No acute findings are evident in the chest, abdomen or pelvis. Electronically Signed   By: Andreas Newport M.D.   On:  05/21/2017 05:15    Cardiac Studies   None  Patient Profile     56 y.o. male with SSCP mildly elevated troponin No acute ECG changes  CT no PE, effusion Or dissection   Assessment & Plan    1) Chest pain: mildly elevated troponin smoker discussed cath in am with patient and risks including Stroke willing to proceed good right radial pulse orders written placed on board continue heparin and nitro 2) Smoking discussed cessation and risk of vascular disease and cancer CT with no lesions 3) Chol:  On statin   Signed, Jenkins Rouge, MD  05/21/2017, 9:44 AM

## 2017-05-21 NOTE — ED Triage Notes (Signed)
Pt presents with back pain that radiates to his chest that began 3 hrs PTA; awoke him from sleep with nausea; pt reports hx of back pain

## 2017-05-21 NOTE — ED Notes (Signed)
Heparin and nitro verified with Mortimer Fries, RN

## 2017-05-21 NOTE — Progress Notes (Signed)
CRITICAL VALUE ALERT  Critical Value: troponin 4.32  Date & Time Notied:  05/21/2017 at 10:37 am  Provider Notified: Meng,PA   Orders Received/Actions taken: Pt currently on 25 mcg of nitro & heparin gtt at 10 units. Per pt pain is in his back but sometimes radiates to his LA causing tingling as well his chest. Awaiting new orders from cardiology. Will continue to monitor the pt. Hoover Brunette, RN

## 2017-05-21 NOTE — ED Notes (Signed)
Nitro gtt increase to 15 mcg/hr per admitting Dr. Marciano Sequin, primary RN notified.

## 2017-05-21 NOTE — Progress Notes (Signed)
eLink Physician-Brief Progress Note Patient Name: Lucas Norton DOB: May 06, 1961 MRN: [id number]   Date of Service  05/21/2017  HPI/Events of Note  Pt seen in ICU s/p Cath for NSTEMI  Mid LAD lesion, 90 %stenosed. A STENT SYNERGY DES 3.5X16 drug eluting stent was successfully placed, postdilated to 3.8 mm.  Mid Cx-2 lesion, 90 %stenosed. A STENT SYNERGY DES 3.5X20 drug eluting stent was successfully placed, postdilated to 3.8 mm in diameter.  Post intervention, there is a 0% residual stenosis.  2nd Diag lesion, 95 %stenosed. This was treated with balloon angioplasty with a 2.5 mm balloon.  Post intervention, there is a 20% residual stenosis.  The left ventricular ejection fraction is 50-55% by visual estimate.  The left ventricular systolic function is normal.  LV end diastolic pressure is normal.  There is no aortic valve stenosis.  eICU Interventions  Pt stable post cath.  Will monitor No Elink interventions needed     Intervention Category Evaluation Type: New Patient Evaluation  Asencion Noble 05/21/2017, 4:15 PM

## 2017-05-21 NOTE — Interval H&P Note (Signed)
Cath Lab Visit (complete for each Cath Lab visit)  Clinical Evaluation Leading to the Procedure:   ACS: Yes.    Non-ACS:    Anginal Classification: CCS IV  Anti-ischemic medical therapy: Maximal Therapy (2 or more classes of medications)  Non-Invasive Test Results: No non-invasive testing performed  Prior CABG: No previous CABG  NSTEMI with ongoing pain.  Plan for urgent cath today rather than waiting until tomorrow. Discussed with Almyra Deforest, PA.     History and Physical Interval Note:  05/21/2017 12:19 PM  Laderius Valbuena  has presented today for surgery, with the diagnosis of STEMI  The various methods of treatment have been discussed with the patient and family. After consideration of risks, benefits and other options for treatment, the patient has consented to  Procedure(s): Left Heart Cath and Coronary Angiography (N/A) as a surgical intervention .  The patient's history has been reviewed, patient examined, no change in status, stable for surgery.  I have reviewed the patient's chart and labs.  Questions were answered to the patient's satisfaction.     Larae Grooms

## 2017-05-22 ENCOUNTER — Telehealth: Payer: Self-pay | Admitting: *Deleted

## 2017-05-22 ENCOUNTER — Inpatient Hospital Stay (HOSPITAL_COMMUNITY): Payer: 59

## 2017-05-22 ENCOUNTER — Telehealth: Payer: Self-pay | Admitting: Internal Medicine

## 2017-05-22 ENCOUNTER — Encounter (HOSPITAL_COMMUNITY): Payer: Self-pay | Admitting: Interventional Cardiology

## 2017-05-22 DIAGNOSIS — I10 Essential (primary) hypertension: Secondary | ICD-10-CM

## 2017-05-22 DIAGNOSIS — E782 Mixed hyperlipidemia: Secondary | ICD-10-CM

## 2017-05-22 DIAGNOSIS — Z720 Tobacco use: Secondary | ICD-10-CM

## 2017-05-22 LAB — BASIC METABOLIC PANEL
Anion gap: 8 (ref 5–15)
BUN: 8 mg/dL (ref 6–20)
CHLORIDE: 109 mmol/L (ref 101–111)
CO2: 24 mmol/L (ref 22–32)
CREATININE: 0.89 mg/dL (ref 0.61–1.24)
Calcium: 8.5 mg/dL — ABNORMAL LOW (ref 8.9–10.3)
GFR calc non Af Amer: >60 mL/min (ref >60)
Glucose, Bld: 102 mg/dL — ABNORMAL HIGH (ref 65–99)
POTASSIUM: 3.5 mmol/L (ref 3.5–5.1)
Sodium: 141 mmol/L (ref 135–145)

## 2017-05-22 LAB — POCT ACTIVATED CLOTTING TIME
Activated Clotting Time: 246 seconds
Activated Clotting Time: 296 seconds

## 2017-05-22 LAB — CBC
HEMATOCRIT: 37.2 % — AB (ref 39.0–52.0)
HEMOGLOBIN: 12.6 g/dL — AB (ref 13.0–17.0)
MCH: 31 pg (ref 26.0–34.0)
MCHC: 33.9 g/dL (ref 30.0–36.0)
MCV: 91.6 fL (ref 78.0–100.0)
Platelets: 174 10*3/uL (ref 150–400)
RBC: 4.06 MIL/uL — AB (ref 4.22–5.81)
RDW: 13.2 % (ref 11.5–15.5)
WBC: 8.4 10*3/uL (ref 4.0–10.5)

## 2017-05-22 LAB — LIPID PANEL
CHOL/HDL RATIO: 3.5 ratio
CHOLESTEROL: 114 mg/dL (ref 0–200)
HDL: 33 mg/dL — AB (ref >40)
LDL Cholesterol: 59 mg/dL (ref 0–99)
Triglycerides: 111 mg/dL (ref <150)
VLDL: 22 mg/dL (ref 0–40)

## 2017-05-22 LAB — TROPONIN I: Troponin I: 4.49 ng/mL (ref <0.03)

## 2017-05-22 LAB — HIV ANTIBODY (ROUTINE TESTING W REFLEX): HIV Screen 4th Generation wRfx: NONREACTIVE

## 2017-05-22 MED ORDER — NICOTINE 21 MG/24HR TD PT24
21.0000 mg | MEDICATED_PATCH | Freq: Every day | TRANSDERMAL | Status: DC
  Administered 2017-05-22: 21 mg via TRANSDERMAL
  Filled 2017-05-22: qty 1

## 2017-05-22 MED ORDER — ASPIRIN 81 MG PO CHEW: 81.0000 mg | CHEWABLE_TABLET | Freq: Every day | ORAL | 0 refills | Status: DC

## 2017-05-22 MED ORDER — NITROGLYCERIN 0.4 MG SL SUBL: 0.4000 mg | SUBLINGUAL_TABLET | SUBLINGUAL | 3 refills | Status: DC | PRN

## 2017-05-22 MED ORDER — TICAGRELOR 90 MG PO TABS: 90.0000 mg | ORAL_TABLET | Freq: Two times a day (BID) | ORAL | 11 refills | Status: DC

## 2017-05-22 MED ORDER — ATORVASTATIN CALCIUM 80 MG PO TABS: 80.0000 mg | ORAL_TABLET | Freq: Every day | ORAL | 1 refills | Status: DC

## 2017-05-22 MED ORDER — METOPROLOL TARTRATE 25 MG PO TABS: 12.5000 mg | ORAL_TABLET | Freq: Two times a day (BID) | ORAL | 2 refills | Status: DC

## 2017-05-22 MED ORDER — NICOTINE 21 MG/24HR TD PT24: 21.0000 mg | MEDICATED_PATCH | Freq: Every day | TRANSDERMAL | 0 refills | Status: DC

## 2017-05-22 MED ORDER — TICAGRELOR 90 MG PO TABS: 90.0000 mg | ORAL_TABLET | Freq: Two times a day (BID) | ORAL | 0 refills | Status: DC

## 2017-05-22 NOTE — Discharge Summary (Signed)
Discharge Summary    Patient ID: Lucas Norton,  MRN: [id number], DOB/AGE: February 06, 1961 56 y.o.  Admit date: 05/21/2017 Discharge date: 05/22/2017  Primary Care Provider: Patient, No Pcp Per Primary Cardiologist: Johnsie Cancel  Discharge Diagnoses    Principal Problem:   NSTEMI (non-ST elevated myocardial infarction) [facility]) Active Problems:   Tobacco abuse   HTN (hypertension)   HLD (hyperlipidemia)   Allergies No Known Allergies  Diagnostic Studies/Procedures    LHC: 05/21/17  Conclusion     Mid LAD lesion, 90 %stenosed. A STENT SYNERGY DES 3.5X16 drug eluting stent was successfully placed, postdilated to 3.8 mm.  Mid Cx-2 lesion, 90 %stenosed. A STENT SYNERGY DES 3.5X20 drug eluting stent was successfully placed, postdilated to 3.8 mm in diameter.  Post intervention, there is a 0% residual stenosis.  2nd Diag lesion, 95 %stenosed. This was treated with balloon angioplasty with a 2.5 mm balloon.  Post intervention, there is a 20% residual stenosis.  The left ventricular ejection fraction is 50-55% by visual estimate.  The left ventricular systolic function is normal.  LV end diastolic pressure is normal.  There is no aortic valve stenosis.   Continue dual antiplatelet therapy for at least one year without interruption.  Patient with prior gastric surgery for bleeding ulcer so SYNERGY stents were placed in the event there are issues with antiplatelet therapy absorption, or bleeding.  He will need to stop smoking and will need high dose lipid lowering therapy.   _____________   History of Present Illness     56 year old man with tobacco abuse, HTN, HLD no known CAD presenting with escalating chest and back pain progressive for 1 month.  He moved from [place] with his wife 1 month ago, not followed here.  At that time, he would have some mild morning chest pain that would get better after 1 hour. It has slowly escalated to the point of being constant the day prior  to admission prompting presentation.  CTA c/a/p negative for dissection and no obvious PE.   In the ED, some relief with NTG but still with active moderate symptoms of chest pain 7/10.  He didn't know his home meds well, but was probably on Amlodipine, one more BP medication and a statin.  Does not take ASA.   No bleeding/bruising issues or other concerns.  No edema.  No fevers or hemoptysis or calf swelling or pain.   He was started on IV heparin and admitted for further work up.   Hospital Course     Trop peaked at 5.37. He was originally planned to undergo cath on Monday, but developed worsening chest pain and was taken to the lab on 05/21/17.  Underwent LHC with Dr. Irish Lack noted above with PCI/DES mLAD, and mLcx. EF noted at 50-55% by LV gram. Plan for DAPT with ASA/Brilinta for at least one year. Labs post cath showed Cr 0.89 and Hgb 12.6. LDL 59. He denied any chest pain post cath. Smoking cessation discussed with the patient throughout admission. Plans to use nicotine patches at time of discharge. He was seen by CM prior to discharge and instructed to obtain samples of Brilinta, as deductible is not currently met from insurance.   He was seen by Dr. Irish Lack and determined stable for discharge home. Follow up in the office has been arranged. Medications are listed below.  _____________  Discharge Vitals Blood pressure (!) 110/93, pulse 93, temperature (!) 97.5 F (36.4 C), temperature source Oral, resp. rate 16, height  _0  (1.778 m), weight 183 lb 6 oz (83.2 kg), SpO2 97 %.  Filed Weights   05/21/17 0334 05/21/17 0831  Weight: 190 lb (86.2 kg) 183 lb 6 oz (83.2 kg)    Labs & Radiologic Studies    CBC  Recent Labs  05/21/17 0338 05/22/17 0214  WBC 10.3 8.4  HGB 14.3 12.6*  HCT 41.3 37.2*  MCV 90.8 91.6  PLT 209 300   Basic Metabolic Panel  Recent Labs  05/21/17 0338 05/22/17 0214  NA 138 141  K 3.3* 3.5  CL 106 109  CO2 23 24  GLUCOSE 103* 102*  BUN 8 8    CREATININE 0.94 0.89  CALCIUM 9.2 8.5*   Liver Function Tests No results for input(s): AST, ALT, ALKPHOS, BILITOT, PROT, ALBUMIN in the last 72 hours. No results for input(s): LIPASE, AMYLASE in the last 72 hours. Cardiac Enzymes  Recent Labs  05/21/17 0850 05/21/17 1642 05/22/17 0214  TROPONINI 4.32* 5.37* 4.49*   BNP Invalid input(s): POCBNP D-Dimer No results for input(s): DDIMER in the last 72 hours. Hemoglobin A1C No results for input(s): HGBA1C in the last 72 hours. Fasting Lipid Panel  Recent Labs  05/22/17 0214  CHOL 114  HDL 33*  LDLCALC 59  TRIG 111  CHOLHDL 3.5   Thyroid Function Tests No results for input(s): TSH, T4TOTAL, T3FREE, THYROIDAB in the last 72 hours.  Invalid input(s): FREET3 _____________  Dg Chest 2 View  Result Date: 05/21/2017 CLINICAL DATA:  Chest and back pain for 3 hours. Hypertension. Smoker. EXAM: CHEST  2 VIEW COMPARISON:  None. FINDINGS: Normal heart size and pulmonary vascularity. Mediastinal contours are intact. Calcified and tortuous aorta. Vague rounded density over the right upper chest probably represents an EKG lead. No airspace disease or consolidation in the lungs. Calcified granulomas in the right lung. No blunting of costophrenic angles. No pneumothorax. Mild degenerative changes in the spine. IMPRESSION: No active cardiopulmonary disease. Electronically Signed   By: Lucienne Capers M.D.   On: 05/21/2017 04:00   Ct Angio Chest/abd/pel For Dissection W And/or Wo Contrast  Result Date: 05/21/2017 CLINICAL DATA:  Midchest pain radiating to the back for 3 hours. EXAM: CT ANGIOGRAPHY CHEST, ABDOMEN AND PELVIS TECHNIQUE: Multidetector CT imaging through the chest, abdomen and pelvis was performed using the standard protocol during bolus administration of intravenous contrast. Multiplanar reconstructed images and MIPs were obtained and reviewed to evaluate the vascular anatomy. CONTRAST:  100 mL Isovue 370 COMPARISON:  None.  FINDINGS: CTA CHEST FINDINGS Cardiovascular: Preferential opacification of the thoracic aorta. No evidence of thoracic aortic aneurysm or dissection. Normal heart size. No pericardial effusion. The pulmonary arteries are also well opacified, with no emboli. Mediastinum/Nodes: No enlarged mediastinal, hilar, or axillary lymph nodes. Thyroid gland, trachea, and esophagus demonstrate no significant findings. Lungs/Pleura: Lungs are clear. No pleural effusion or pneumothorax. Incidental calcified granulomata. Musculoskeletal: No significant skeletal lesions. Review of the MIP images confirms the above findings. CTA ABDOMEN AND PELVIS FINDINGS VASCULAR Aorta: Normal caliber aorta without aneurysm, dissection, vasculitis or significant stenosis. Moderate atherosclerotic calcifications and nonstenotic plaque. Celiac: Patent without evidence of aneurysm, dissection, vasculitis or significant stenosis. SMA: Patent without evidence of aneurysm, dissection, vasculitis or significant stenosis. Renals: Both renal arteries are patent without evidence of aneurysm, dissection, vasculitis, fibromuscular dysplasia or significant stenosis. IMA: Patent without evidence of aneurysm, dissection, vasculitis or significant stenosis. Inflow: Patent without evidence of aneurysm, dissection, vasculitis or significant stenosis. Veins: No obvious venous abnormality within the limitations of  this arterial phase study. Review of the MIP images confirms the above findings. NON-VASCULAR Hepatobiliary: No focal liver abnormality is seen. No gallstones, gallbladder wall thickening, or biliary dilatation. Pancreas: Unremarkable. No pancreatic ductal dilatation or surrounding inflammatory changes. Spleen: Normal in size without focal abnormality. Adrenals/Urinary Tract: Adrenal glands are unremarkable. Kidneys are normal, without renal calculi, focal lesion, or hydronephrosis. Bladder is unremarkable. Stomach/Bowel: Stomach has been partially resected,  probably with gastrojejunostomy. Small bowel is otherwise unremarkable. Appendix is normal. No evidence of bowel wall thickening, distention, or inflammatory changes. Lymphatic: No adenopathy in the abdomen or pelvis. Reproductive: Unremarkable Other: No focal inflammation.  No ascites. Musculoskeletal: No significant skeletal lesion. Moderately severe lumbar degenerative disc changes at L3-4 and L4-5. Review of the MIP images confirms the above findings. IMPRESSION: 1. Aortic atherosclerosis.  No evidence of acute aortic syndrome. 2. No acute findings are evident in the chest, abdomen or pelvis. Electronically Signed   By: Andreas Newport M.D.   On: 05/21/2017 05:15   Disposition   Pt is being discharged home today in good condition.  Follow-up Plans & Appointments    Follow-up Information    Isaiah Serge, NP Follow up on 06/02/2017.   Specialties:  Cardiology, Radiology Why:  at 11:30am for your follow up appt.  Contact information: [address] [place] [address] [phone]          Discharge Instructions    Amb Referral to Cardiac Rehabilitation    Complete by:  As directed    Diagnosis:   Coronary Stents NSTEMI PTCA     Call MD for:  redness, tenderness, or signs of infection (pain, swelling, redness, odor or green/yellow discharge around incision site)    Complete by:  As directed    Diet - low sodium heart healthy    Complete by:  As directed    Discharge instructions    Complete by:  As directed    Radial Site Care Refer to this sheet in the next few weeks. These instructions provide you with information on caring for yourself after your procedure. Your caregiver may also give you more specific instructions. Your treatment has been planned according to current medical practices, but problems sometimes occur. Call your caregiver if you have any problems or questions after your procedure. HOME CARE INSTRUCTIONS You may shower the day after the  procedure.Remove the bandage (dressing) and gently wash the site with plain soap and water.Gently pat the site dry.  Do not apply powder or lotion to the site.  Do not submerge the affected site in water for 3 to 5 days.  Inspect the site at least twice daily.  Do not flex or bend the affected arm for 24 hours.  No lifting over 5 pounds (2.3 kg) for 5 days after your procedure.  Do not drive home if you are discharged the same day of the procedure. Have someone else drive you.  You may drive 24 hours after the procedure unless otherwise instructed by your caregiver.  What to expect: Any bruising will usually fade within 1 to 2 weeks.  Blood that collects in the tissue (hematoma) may be painful to the touch. It should usually decrease in size and tenderness within 1 to 2 weeks.  SEEK IMMEDIATE MEDICAL CARE IF: You have unusual pain at the radial site.  You have redness, warmth, swelling, or pain at the radial site.  You have drainage (other than a small amount of blood on the dressing).  You have chills.  You have a fever or persistent symptoms for more than 72 hours.  You have a fever and your symptoms suddenly get worse.  Your arm becomes pale, cool, tingly, or numb.  You have heavy bleeding from the site. Hold pressure on the site.   PLEASE DO NOT MISS ANY DOSES OF YOUR BRILINTA!!!!! Also check your blood pressure at home and bring to follow up appt. We stopped your old blood pressure medications and started a new one called metoprolol along with lipitor. Please call the office with any questions.   Increase activity slowly    Complete by:  As directed       Discharge Medications   Current Discharge Medication List    START taking these medications   Details  aspirin 81 MG chewable tablet Chew 1 tablet (81 mg total) by mouth daily. Qty: 30 tablet, Refills: 0    metoprolol tartrate (LOPRESSOR) 25 MG tablet Take 0.5 tablets (12.5 mg total) by mouth 2 (two) times daily. Qty: 60  tablet, Refills: 2    nicotine (NICODERM CQ - DOSED IN MG/24 HOURS) 21 mg/24hr patch Place 1 patch (21 mg total) onto the skin daily. Qty: 28 patch, Refills: 0    nitroGLYCERIN (NITROSTAT) 0.4 MG SL tablet Place 1 tablet (0.4 mg total) under the tongue every 5 (five) minutes as needed. Qty: 25 tablet, Refills: 3      CONTINUE these medications which have CHANGED   Details  atorvastatin (LIPITOR) 80 MG tablet Take 1 tablet (80 mg total) by mouth daily at 6 PM. Qty: 90 tablet, Refills: 1    ticagrelor (BRILINTA) 90 MG TABS tablet Take 1 tablet (90 mg total) by mouth 2 (two) times daily. Qty: 60 tablet, Refills: 11      STOP taking these medications     amLODipine (NORVASC) 10 MG tablet      losartan-hydrochlorothiazide (HYZAAR) 100-25 MG tablet          Aspirin prescribed at discharge?  Yes High Intensity Statin Prescribed? (Lipitor 40-23m or Crestor 20-462m: Yes Beta Blocker Prescribed? Yes For EF <40%, was ACEI/ARB Prescribed? No: Soft blood pressures, consider adding at follow up appt.  ADP Receptor Inhibitor Prescribed? (i.e. Plavix etc.-Includes Medically Managed Patients): Yes For EF <40%, Aldosterone Inhibitor Prescribed? No: EF ok Was EF assessed during THIS hospitalization? Yes Was Cardiac Rehab II ordered? (Included Medically managed Patients): Yes   Outstanding Labs/Studies   FLP/LFTs in 6 weeks if tolerating statin.  Duration of Discharge Encounter   Greater than 30 minutes including physician time.  Signed, LiReino BellisP-C 05/22/2017, 2:35 PM   I have examined the patient and reviewed assessment and plan and discussed with patient.  Agree with above as stated.  Stressed importance of DAPT and smoking cessation.  He will do cardiac rehab.  Please see my note from earlier today.  Radial precautions given.   JaLarae Grooms

## 2017-05-22 NOTE — Telephone Encounter (Signed)
SAMPLES AVAILABLE FOR PICK UP 90 MG  BRILINTA  --4 BOTTLES  DIRECTION GIVEN TO DEBRA( CASE MANAGER) FOR PATIENT TO PICK UP SAMPLES . SHE STATES PATIENT HAS SAVING CARD.  HE WILL HAVE TO PAY 20% OF HID DEDUCTIBLE.  DEBRA STATES SHE WILL GIVE DIRECTION TO PATIENT

## 2017-05-22 NOTE — Progress Notes (Signed)
Cardiologist paged and notified patient with c/o anxiety. Had this feeling before when trying to quit smoking, has been without cigarette since admission. Patient denies chest pain, epigastric pain, or shortness of breath.

## 2017-05-22 NOTE — Progress Notes (Signed)
3.  VENDERE @ [facility] # [phone]   BRILINTA 90 MG BID   COVER- YES  CO-PAY- 20 % OF TOTAL COAST- DEDUCTIBLE NOT MET  TIER- 3 DRUG  PRIOR APPROVAL NO   PREFERRED PHARMACY : [facility]

## 2017-05-22 NOTE — Telephone Encounter (Signed)
-----   Message from Damian Leavell, RN sent at 05/22/2017  3:12 PM EDT ----- Regarding: FW: toc   ----- Message ----- From: Howie Ill Sent: 05/22/2017   2:16 PM To: Damian Leavell, RN Subject: toc                                            06/02/17 Cecilie Kicks

## 2017-05-22 NOTE — Care Management Note (Addendum)
Case Management Note  Patient Details  Name: Lucas Norton MRN: [id number] Date of Birth: 04/07/1961  Subjective/Objective:  From home with wife, pta indep, presents with STEMI, s/p coronary stent intervention, will be on brilinta.  NCM gave patient the brilinta $5 coupon card.  NCM awaiting benefit check.  Also patient just moved here and does not have a PCP, NCM gave him the [facility] information to help him find a PCP in network. [facility] [place] on [address] to get the first 30 day supply.  He will need some samples, CHF clinic did not have any right now, and  Cards office on [facility], do not have any samples right now. The [facility] office has samples for two weeks to give patient. NCM contacted brilinta rep, he will bring samples to [facility] and to the hospital.                   Action/Plan: NCM will follow for dc needs.   Expected Discharge Date:                  Expected Discharge Plan:  Home/Self Care  In-House Referral:     Discharge planning Services  CM Consult  Post Acute Care Choice:    Choice offered to:     DME Arranged:    DME Agency:     HH Arranged:    [place] Agency:     Status of Service:  Completed, signed off  If discussed at [employer] of Stay Meetings, dates discussed:    Additional Comments: VENDERE @ [facility] # [phone]   BRILINTA 90 MG BID   COVER- YES  CO-PAY- 20 % OF TOTAL COAST- DEDUCTIBLE NOT MET  TIER- 3 DRUG  PRIOR APPROVAL NO   [facility]      [name], RN 05/22/2017, 11:00 AM

## 2017-05-22 NOTE — Progress Notes (Signed)
Discharge instructions (including medications) discussed with and copy provided to patient/caregiver 

## 2017-05-22 NOTE — Progress Notes (Signed)
CARDIAC REHAB PHASE I   PRE:  Rate/Rhythm: 87 SR with PVCs    BP: sitting 120/83    SaO2: 99 RA  MODE:  Ambulation: 370 ft   POST:  Rate/Rhythm: 102 ST with PVCs    BP: sitting 132/73     SaO2: 95 RA  Tolerated fairly well. C/o lower back pain. No sx like admit. Pt has been feeling anxious this am due to nicotine withdrawal. Long discussion of smoking cessation, Brilinta, MI, stent, diet, ex, NTG, and CRPII. Pt and wife receptive, planning to quit smoking together. Will refer to [place]. He understands Brilinta and CM coming to see now. [phone]   [place], ACSM 05/22/2017 10:48 AM

## 2017-05-22 NOTE — Progress Notes (Signed)
Progress Note  Patient Name: Brittney Caraway Date of Encounter: 05/22/2017  Primary Cardiologist: Johnsie Cancel  Subjective   No sx like his MI.  Inpatient Medications    Scheduled Meds: . aspirin  81 mg Oral Daily  . atorvastatin  80 mg Oral q1800  . metoprolol tartrate  12.5 mg Oral BID  . nicotine  21 mg Transdermal Daily  . sodium chloride flush  3 mL Intravenous Q12H  . ticagrelor  90 mg Oral BID   Continuous Infusions: . sodium chloride Stopped (05/21/17 2030)   PRN Meds: sodium chloride, acetaminophen, ondansetron (ZOFRAN) IV, sodium chloride flush   Vital Signs    Vitals:   05/22/17 1000 05/22/17 1100 05/22/17 1109 05/22/17 1229  BP:   (!) 110/93   Pulse: 93     Resp: 15 13 16    Temp:    (!) 97.5 F (36.4 C)  TempSrc:    Oral  SpO2: 93%  97%   Weight:      Height:        Intake/Output Summary (Last 24 hours) at 05/22/17 1328 Last data filed at 05/22/17 0900  Gross per 24 hour  Intake           888.33 ml  Output             2250 ml  Net         -1361.67 ml   Filed Weights   05/21/17 0334 05/21/17 0831  Weight: 190 lb (86.2 kg) 183 lb 6 oz (83.2 kg)    Telemetry    NSR, PVC - Personally Reviewed  ECG    NSR, NSST - Personally Reviewed  Physical Exam   GEN: No acute distress.   Neck: No JVD Cardiac: RRR, no murmurs, rubs, or gallops.  Respiratory: Clear to auscultation bilaterally. GI: Soft, nontender, non-distended  MS: No edema; No deformity. Mildly rtender right radial site with 3+ pulse Neuro:  Nonfocal  Psych: Normal affect   Labs    Chemistry Recent Labs Lab 05/21/17 0338 05/22/17 0214  NA 138 141  K 3.3* 3.5  CL 106 109  CO2 23 24  GLUCOSE 103* 102*  BUN 8 8  CREATININE 0.94 0.89  CALCIUM 9.2 8.5*  GFRNONAA >60 >60  GFRAA >60 >60  ANIONGAP 9 8     Hematology Recent Labs Lab 05/21/17 0338 05/22/17 0214  WBC 10.3 8.4  RBC 4.55 4.06*  HGB 14.3 12.6*  HCT 41.3 37.2*  MCV 90.8 91.6  MCH 31.4 31.0  MCHC 34.6  33.9  RDW 13.1 13.2  PLT 209 174    Cardiac Enzymes Recent Labs Lab 05/21/17 0850 05/21/17 1642 05/22/17 0214  TROPONINI 4.32* 5.37* 4.49*    Recent Labs Lab 05/21/17 0359  TROPIPOC 0.16*     BNPNo results for input(s): BNP, PROBNP in the last 168 hours.   DDimer No results for input(s): DDIMER in the last 168 hours.   Radiology    Dg Chest 2 View  Result Date: 05/21/2017 CLINICAL DATA:  Chest and back pain for 3 hours. Hypertension. Smoker. EXAM: CHEST  2 VIEW COMPARISON:  None. FINDINGS: Normal heart size and pulmonary vascularity. Mediastinal contours are intact. Calcified and tortuous aorta. Vague rounded density over the right upper chest probably represents an EKG lead. No airspace disease or consolidation in the lungs. Calcified granulomas in the right lung. No blunting of costophrenic angles. No pneumothorax. Mild degenerative changes in the spine. IMPRESSION: No active cardiopulmonary disease. Electronically Signed  By: Lucienne Capers M.D.   On: 05/21/2017 04:00   Ct Angio Chest/abd/pel For Dissection W And/or Wo Contrast  Result Date: 05/21/2017 CLINICAL DATA:  Midchest pain radiating to the back for 3 hours. EXAM: CT ANGIOGRAPHY CHEST, ABDOMEN AND PELVIS TECHNIQUE: Multidetector CT imaging through the chest, abdomen and pelvis was performed using the standard protocol during bolus administration of intravenous contrast. Multiplanar reconstructed images and MIPs were obtained and reviewed to evaluate the vascular anatomy. CONTRAST:  100 mL Isovue 370 COMPARISON:  None. FINDINGS: CTA CHEST FINDINGS Cardiovascular: Preferential opacification of the thoracic aorta. No evidence of thoracic aortic aneurysm or dissection. Normal heart size. No pericardial effusion. The pulmonary arteries are also well opacified, with no emboli. Mediastinum/Nodes: No enlarged mediastinal, hilar, or axillary lymph nodes. Thyroid gland, trachea, and esophagus demonstrate no significant findings.  Lungs/Pleura: Lungs are clear. No pleural effusion or pneumothorax. Incidental calcified granulomata. Musculoskeletal: No significant skeletal lesions. Review of the MIP images confirms the above findings. CTA ABDOMEN AND PELVIS FINDINGS VASCULAR Aorta: Normal caliber aorta without aneurysm, dissection, vasculitis or significant stenosis. Moderate atherosclerotic calcifications and nonstenotic plaque. Celiac: Patent without evidence of aneurysm, dissection, vasculitis or significant stenosis. SMA: Patent without evidence of aneurysm, dissection, vasculitis or significant stenosis. Renals: Both renal arteries are patent without evidence of aneurysm, dissection, vasculitis, fibromuscular dysplasia or significant stenosis. IMA: Patent without evidence of aneurysm, dissection, vasculitis or significant stenosis. Inflow: Patent without evidence of aneurysm, dissection, vasculitis or significant stenosis. Veins: No obvious venous abnormality within the limitations of this arterial phase study. Review of the MIP images confirms the above findings. NON-VASCULAR Hepatobiliary: No focal liver abnormality is seen. No gallstones, gallbladder wall thickening, or biliary dilatation. Pancreas: Unremarkable. No pancreatic ductal dilatation or surrounding inflammatory changes. Spleen: Normal in size without focal abnormality. Adrenals/Urinary Tract: Adrenal glands are unremarkable. Kidneys are normal, without renal calculi, focal lesion, or hydronephrosis. Bladder is unremarkable. Stomach/Bowel: Stomach has been partially resected, probably with gastrojejunostomy. Small bowel is otherwise unremarkable. Appendix is normal. No evidence of bowel wall thickening, distention, or inflammatory changes. Lymphatic: No adenopathy in the abdomen or pelvis. Reproductive: Unremarkable Other: No focal inflammation.  No ascites. Musculoskeletal: No significant skeletal lesion. Moderately severe lumbar degenerative disc changes at L3-4 and L4-5.  Review of the MIP images confirms the above findings. IMPRESSION: 1. Aortic atherosclerosis.  No evidence of acute aortic syndrome. 2. No acute findings are evident in the chest, abdomen or pelvis. Electronically Signed   By: Andreas Newport M.D.   On: 05/21/2017 05:15    Cardiac Studies   Cath results  Patient Profile     56 y.o. male with NSTEMI and multivessel PCI  Assessment & Plan    Continue DAPT.  If there are cost issues with Brilinta, could be switched to Plavix 75 mg daily after a month.    Continue statin and beta blocker as well.   Radial site stable.    Nicotine patches at discharge.  Chantix was costly in the past.  Plan discharge today.  Out of work for one week.   Signed, Larae Grooms, MD  05/22/2017, 1:28 PM

## 2017-05-22 NOTE — Telephone Encounter (Signed)
New message       Patient calling the office for samples of medication:   1.  What medication and dosage are you requesting samples for?    brilinta 90mg   2.  Are you currently out of this medication? Pt is being discharged from hosp and need samples

## 2017-05-23 ENCOUNTER — Telehealth (HOSPITAL_COMMUNITY): Payer: Self-pay

## 2017-05-23 MED FILL — Tirofiban HCl in NaCl 0.9% IV Soln 5 MG/100ML (Base Equiv): INTRAVENOUS | Qty: 100 | Status: AC

## 2017-05-23 NOTE — Telephone Encounter (Signed)
Patient insurance is active and benefits verified. Patient insurance is [employer] - no co-payment, deductible $1500/$29.86 has been met, out of pocket $7350/$3593.09 has been met, 20% co-insurance, no pre-authorization and no limit on visit. Passport/reference [id number].  Patient will be contacted and scheduled after their follow up appointment with the cardiologist office on 06/02/17, upon review by [facility] RN navigator.

## 2017-05-23 NOTE — Telephone Encounter (Signed)
Left message to call back  

## 2017-05-23 NOTE — Telephone Encounter (Signed)
Patient contacted regarding discharge from [facility] on 05/22/17  Patient understands to follow up with provider Yes. --Cecilie Kicks, NP on 06/02/17 at 11:30 at [place] [address]   Patient understands discharge instructions? Yes  Patient understands medications and regiment?  Yes  Patient understands to bring all medications to this visit? Yes  Pt states he has a runny nose and has been sneezing.  Asking if OK to take antihistamine.  I told him this would be OK but he should avoid decongestants.

## 2017-05-26 ENCOUNTER — Telehealth: Payer: Self-pay | Admitting: Cardiology

## 2017-05-26 DIAGNOSIS — S40021A Contusion of right upper arm, initial encounter: Secondary | ICD-10-CM

## 2017-05-26 DIAGNOSIS — M7989 Other specified soft tissue disorders: Secondary | ICD-10-CM

## 2017-05-26 NOTE — Telephone Encounter (Signed)
Call transferred to triage.  Pt had cath by left radial site on 7/29.  Today he notes about an inch long, raised, tender area at the site.  A dry scabbed area is present.  The extremity from wrist to elbow is "almost black" with bruising.   He has normal strength.  He states is may be little cooler than right hand but minimal difference.  He states if he presses anywhere on the bruised area he gets a tingling sensation from elbow to wrist.  Pt has appointment with Cecilie Kicks, NP in 1 week.  Pt is aware I will forward to Dr. Johnsie Cancel and his nurse and will call him back with further recommendations.

## 2017-05-26 NOTE — Telephone Encounter (Signed)
Follow up  Please see below attached to wrong provider should be MD-Ross.

## 2017-05-26 NOTE — Telephone Encounter (Addendum)
There are no openings at [facility] on Monday per [facility]. Will forward to Triage to follow-up on Monday.

## 2017-05-26 NOTE — Telephone Encounter (Signed)
New Message  Pt voiced had sugery last sunday and f/u with laura ingold last friday.   Pt voiced looking at arm, pt voiced it is swollen and has a lump the size of a marble.   Pt voiced wanting to know if this is something he needs to be concern about or can he wait until 8.10.18 appt.  Please f/u

## 2017-05-26 NOTE — Telephone Encounter (Signed)
Order placed for upper extremity doppler to be done 8/6 at [facility] per Dr. Johnsie Cancel.    Unable to reach [facility] scheduler or supervisor at this time.   Left voice mail for supervisor that patient needs study Monday per MD and will also send staff message to her and pre cert.  Pt is aware that he should receive a call on Monday regarding appointment time,

## 2017-05-26 NOTE — Telephone Encounter (Signed)
Pam- Have him get duplex of right radial at [facility] Monday to r/o pseudoaneurysm Knows to go to ER over weekend If worsens Was bruised from time of procedure  I cc'ed JV who did procedure

## 2017-05-29 ENCOUNTER — Encounter (HOSPITAL_COMMUNITY): Payer: Self-pay | Admitting: Cardiology

## 2017-05-29 ENCOUNTER — Ambulatory Visit (HOSPITAL_COMMUNITY)
Admission: RE | Admit: 2017-05-29 | Discharge: 2017-05-29 | Disposition: A | Discharge status: Home / Self Care | Payer: 59 | Source: Ambulatory Visit | Attending: Cardiology | Admitting: Cardiology

## 2017-05-29 DIAGNOSIS — S40021A Contusion of right upper arm, initial encounter: Secondary | ICD-10-CM

## 2017-05-29 DIAGNOSIS — M7989 Other specified soft tissue disorders: Secondary | ICD-10-CM | POA: Diagnosis not present

## 2017-05-29 NOTE — Progress Notes (Signed)
No evidence of RIGHT arm pseudoaneurysm or AV fistula over cath site. Limited evaluation of the LEFT forearm veins performed due to sudden onset left hand swelling---negative for venous thrombus.

## 2017-05-29 NOTE — Progress Notes (Signed)
[place] ok no thrombus and no pseudoaneursym

## 2017-05-29 NOTE — Telephone Encounter (Signed)
Per Missy at [facility], pt can be seen at 2 pm today.  Called and left message for pt to call back to discuss time.

## 2017-05-29 NOTE — Telephone Encounter (Signed)
Pt called back and has been scheduled for 2 pm today for upper extremity doppler as ordered.

## 2017-05-31 ENCOUNTER — Telehealth: Payer: Self-pay | Admitting: Cardiovascular Disease

## 2017-05-31 ENCOUNTER — Observation Stay (HOSPITAL_COMMUNITY)
Admission: EM | Admit: 2017-05-31 | Discharge: 2017-06-01 | Disposition: A | Discharge status: Home / Self Care | Payer: 59 | Attending: Cardiology | Admitting: Cardiology

## 2017-05-31 ENCOUNTER — Other Ambulatory Visit: Payer: Self-pay

## 2017-05-31 ENCOUNTER — Emergency Department (HOSPITAL_COMMUNITY): Payer: 59

## 2017-05-31 ENCOUNTER — Encounter (HOSPITAL_COMMUNITY): Payer: Self-pay | Admitting: Emergency Medicine

## 2017-05-31 DIAGNOSIS — Z720 Tobacco use: Secondary | ICD-10-CM | POA: Diagnosis present

## 2017-05-31 DIAGNOSIS — I252 Old myocardial infarction: Secondary | ICD-10-CM | POA: Insufficient documentation

## 2017-05-31 DIAGNOSIS — Z955 Presence of coronary angioplasty implant and graft: Secondary | ICD-10-CM | POA: Diagnosis not present

## 2017-05-31 DIAGNOSIS — Z7982 Long term (current) use of aspirin: Secondary | ICD-10-CM | POA: Diagnosis not present

## 2017-05-31 DIAGNOSIS — E785 Hyperlipidemia, unspecified: Secondary | ICD-10-CM | POA: Diagnosis present

## 2017-05-31 DIAGNOSIS — Z79899 Other long term (current) drug therapy: Secondary | ICD-10-CM | POA: Insufficient documentation

## 2017-05-31 DIAGNOSIS — I700 Atherosclerosis of aorta: Secondary | ICD-10-CM | POA: Diagnosis not present

## 2017-05-31 DIAGNOSIS — I25118 Atherosclerotic heart disease of native coronary artery with other forms of angina pectoris: Secondary | ICD-10-CM

## 2017-05-31 DIAGNOSIS — I214 Non-ST elevation (NSTEMI) myocardial infarction: Secondary | ICD-10-CM | POA: Diagnosis not present

## 2017-05-31 DIAGNOSIS — F17210 Nicotine dependence, cigarettes, uncomplicated: Secondary | ICD-10-CM | POA: Insufficient documentation

## 2017-05-31 DIAGNOSIS — J8410 Pulmonary fibrosis, unspecified: Secondary | ICD-10-CM | POA: Insufficient documentation

## 2017-05-31 DIAGNOSIS — R079 Chest pain, unspecified: Secondary | ICD-10-CM | POA: Diagnosis present

## 2017-05-31 DIAGNOSIS — I10 Essential (primary) hypertension: Secondary | ICD-10-CM | POA: Diagnosis not present

## 2017-05-31 DIAGNOSIS — E7800 Pure hypercholesterolemia, unspecified: Secondary | ICD-10-CM | POA: Insufficient documentation

## 2017-05-31 DIAGNOSIS — M5136 Other intervertebral disc degeneration, lumbar region: Secondary | ICD-10-CM | POA: Insufficient documentation

## 2017-05-31 DIAGNOSIS — I200 Unstable angina: Secondary | ICD-10-CM

## 2017-05-31 LAB — CBC
HCT: 40.8 % (ref 39.0–52.0)
Hemoglobin: 14 g/dL (ref 13.0–17.0)
MCH: 31.4 pg (ref 26.0–34.0)
MCHC: 34.3 g/dL (ref 30.0–36.0)
MCV: 91.5 fL (ref 78.0–100.0)
Platelets: 237 K/uL (ref 150–400)
RBC: 4.46 MIL/uL (ref 4.22–5.81)
RDW: 12.9 % (ref 11.5–15.5)
WBC: 8.5 K/uL (ref 4.0–10.5)

## 2017-05-31 LAB — BASIC METABOLIC PANEL
Anion gap: 7 (ref 5–15)
BUN: 15 mg/dL (ref 6–20)
CALCIUM: 9.3 mg/dL (ref 8.9–10.3)
CHLORIDE: 110 mmol/L (ref 101–111)
CO2: 22 mmol/L (ref 22–32)
CREATININE: 1.03 mg/dL (ref 0.61–1.24)
GFR calc Af Amer: >60 mL/min (ref >60)
Glucose, Bld: 101 mg/dL — ABNORMAL HIGH (ref 65–99)
Potassium: 4.4 mmol/L (ref 3.5–5.1)
SODIUM: 139 mmol/L (ref 135–145)

## 2017-05-31 LAB — I-STAT TROPONIN, ED
Troponin i, poc: 0.02 ng/mL (ref 0.00–0.08)
Troponin i, poc: 0.01 ng/mL (ref 0.00–0.08)

## 2017-05-31 LAB — HEPATIC FUNCTION PANEL
ALK PHOS: 65 U/L (ref 38–126)
ALT: 25 U/L (ref 17–63)
AST: 22 U/L (ref 15–41)
Albumin: 4.1 g/dL (ref 3.5–5.0)
BILIRUBIN INDIRECT: 0.6 mg/dL (ref 0.3–0.9)
Bilirubin, Direct: 0.1 mg/dL (ref 0.1–0.5)
Total Bilirubin: 0.7 mg/dL (ref 0.3–1.2)
Total Protein: 6.6 g/dL (ref 6.5–8.1)

## 2017-05-31 LAB — LIPASE, BLOOD: Lipase: 25 U/L (ref 11–51)

## 2017-05-31 LAB — TROPONIN I

## 2017-05-31 MED ORDER — SODIUM CHLORIDE 0.9 % IV SOLN: 250.0000 mL | INTRAVENOUS | Status: DC | PRN

## 2017-05-31 MED ORDER — SODIUM CHLORIDE 0.9% FLUSH
3.0000 mL | Freq: Two times a day (BID) | INTRAVENOUS | Status: DC
  Administered 2017-05-31 – 2017-06-01 (×2): 3 mL via INTRAVENOUS

## 2017-05-31 MED ORDER — NITROGLYCERIN 0.4 MG SL SUBL: 0.4000 mg | SUBLINGUAL_TABLET | SUBLINGUAL | Status: DC | PRN

## 2017-05-31 MED ORDER — ATORVASTATIN CALCIUM 80 MG PO TABS: 80.0000 mg | ORAL_TABLET | Freq: Every day | ORAL | Status: DC

## 2017-05-31 MED ORDER — METOPROLOL TARTRATE 12.5 MG HALF TABLET
12.5000 mg | ORAL_TABLET | Freq: Two times a day (BID) | ORAL | Status: DC
  Administered 2017-05-31 – 2017-06-01 (×2): 12.5 mg via ORAL
  Filled 2017-05-31 (×2): qty 1

## 2017-05-31 MED ORDER — ALPRAZOLAM 0.25 MG PO TABS: 0.2500 mg | ORAL_TABLET | Freq: Two times a day (BID) | ORAL | Status: DC | PRN

## 2017-05-31 MED ORDER — TICAGRELOR 90 MG PO TABS
90.0000 mg | ORAL_TABLET | Freq: Two times a day (BID) | ORAL | Status: DC
  Administered 2017-05-31: 90 mg via ORAL
  Filled 2017-05-31 (×2): qty 1

## 2017-05-31 MED ORDER — SODIUM CHLORIDE 0.9% FLUSH: 3.0000 mL | INTRAVENOUS | Status: DC | PRN

## 2017-05-31 MED ORDER — ACETAMINOPHEN 325 MG PO TABS: 650.0000 mg | ORAL_TABLET | ORAL | Status: DC | PRN

## 2017-05-31 MED ORDER — METOPROLOL TARTRATE 25 MG PO TABS: 25.0000 mg | ORAL_TABLET | Freq: Two times a day (BID) | ORAL | Status: DC

## 2017-05-31 MED ORDER — ZOLPIDEM TARTRATE 5 MG PO TABS: 5.0000 mg | ORAL_TABLET | Freq: Every evening | ORAL | Status: DC | PRN

## 2017-05-31 MED ORDER — ONDANSETRON HCL 4 MG/2ML IJ SOLN: 4.0000 mg | Freq: Four times a day (QID) | INTRAMUSCULAR | Status: DC | PRN

## 2017-05-31 MED ORDER — NICOTINE 21 MG/24HR TD PT24
21.0000 mg | MEDICATED_PATCH | Freq: Every day | TRANSDERMAL | Status: DC
  Filled 2017-05-31: qty 1

## 2017-05-31 MED ORDER — ASPIRIN 81 MG PO CHEW
81.0000 mg | CHEWABLE_TABLET | Freq: Every day | ORAL | Status: DC
  Administered 2017-06-01: 81 mg via ORAL
  Filled 2017-05-31: qty 1

## 2017-05-31 MED ORDER — ISOSORBIDE MONONITRATE ER 30 MG PO TB24
30.0000 mg | ORAL_TABLET | Freq: Every day | ORAL | Status: DC
  Administered 2017-06-01: 30 mg via ORAL
  Filled 2017-05-31: qty 1

## 2017-05-31 NOTE — ED Triage Notes (Signed)
Pt had MI on Sunday. Started walking today and felt abdominal pain and became SOB. Took nitro x 3 on way in (1 hour and half) and felt relief. Pain began to creep to back and chest in waiting room. 5/10 chest pain. Put in 2 stents.

## 2017-05-31 NOTE — H&P (Signed)
Patient ID: Lucas Norton MRN: [id number], DOB/AGE: [birth year]   Admit date: 05/31/2017  Requesting Physician: Primary Physician: Patient, No Pcp Per Primary Cardiologist:  Reola Calkins Johnsie Cancel) Reason for admission: Chest Pain  HPI:  Lucas Norton is a 56 y.o. male with a history of recent cardiac catheterization on 05/21/2017 for an NSTEMI obtaining two synergy stents DES and started on Brilinta and Aspirin. He was just recently discharged on 7/30.  On August 3 there was concern about his radial catheter site and a right radial duplex ordered resulting in no thrombus and no pseudoaneurysm. He called this office this morning complaining of abdominal pain and back pain. He took nitroglycerin and his back pain pain alleviated but the abdominal pain continued. The symptoms started in the context of walking for exercise. He described it as constant, aching, tightening from mid abdomen to mid back with associated SOB. He said that this pains were consistent with those he had before his recent MI. Dr. Burt Knack advised patient to come to ER for evaluation.  ER Course: CBC and BMP within normal limits. Troponin is negative x 2, chest ray showing no acute changes from previous admission.  New T-wave inversions in the inferior leads.  He has decreased his smoking significantly, reports being compliant with his medications. Currently pain free   Blood pressure (!) 140/97, pulse (!) 47, temperature 98 F (36.7 C), temperature source Oral, resp. rate 17, SpO2 100 %.   Problem List  Past Medical History:  Diagnosis Date  . Back pain   . Current every day smoker   . HLD (hyperlipidemia)   . Hypercholesteremia   . Hypertension   . NSTEMI (non-ST elevated myocardial infarction) ([place]) 05/21/2017   05/21/17 PCI/DES to mLAD, mLcx. EF normal    Past Surgical History:  Procedure Laterality Date  . BACK SURGERY    . CORONARY BALLOON ANGIOPLASTY N/A 05/21/2017   Procedure: Coronary Balloon Angioplasty;   Surgeon: Jettie Booze, MD;  Location: [facility];  Service: Cardiovascular;  Laterality: N/A;  Mid Diag  . CORONARY STENT INTERVENTION N/A 05/21/2017   Procedure: Coronary Stent Intervention;  Surgeon: Jettie Booze, MD;  Location: [place];  Service: Cardiovascular;  Laterality: N/A;  Mid LAD,Mid CFX  . LEFT HEART CATH AND CORONARY ANGIOGRAPHY N/A 05/21/2017   Procedure: Left Heart Cath and Coronary Angiography;  Surgeon: Jettie Booze, MD;  Location: [place] [facility];  Service: Cardiovascular;  Laterality: N/A;  . PARTIAL GASTRECTOMY       Allergies  No Known Allergies   Home Medications  Prior to Admission medications   Medication Sig Start Date End Date Taking? Authorizing Provider  aspirin 81 MG chewable tablet Chew 1 tablet (81 mg total) by mouth daily. 05/23/17   Cheryln Manly, NP  atorvastatin (LIPITOR) 80 MG tablet Take 1 tablet (80 mg total) by mouth daily at 6 PM. 05/22/17   Cheryln Manly, NP  metoprolol tartrate (LOPRESSOR) 25 MG tablet Take 0.5 tablets (12.5 mg total) by mouth 2 (two) times daily. 05/22/17   Cheryln Manly, NP  nicotine (NICODERM CQ - DOSED IN MG/24 HOURS) 21 mg/24hr patch Place 1 patch (21 mg total) onto the skin daily. 05/23/17   Cheryln Manly, NP  nitroGLYCERIN (NITROSTAT) 0.4 MG SL tablet Place 1 tablet (0.4 mg total) under the tongue every 5 (five) minutes as needed. 05/22/17   Cheryln Manly, NP  ticagrelor (BRILINTA) 90 MG TABS tablet Take  1 tablet (90 mg total) by mouth 2 (two) times daily. 05/22/17   Cheryln Manly, NP    Family History  Family History  Problem Relation Age of Onset  . Hypertension Father     Family Status  Relation Status  . Father (Not Specified)    Social History  Social History   Social History  . Marital status: Married    Spouse name: N/A  . Number of children: N/A  . Years of education: N/A   Occupational History  . Not on file.   Social History  Main Topics  . Smoking status: Current Every Day Smoker    Packs/day: 1.00    Years: 20.00    Types: Cigarettes  . Smokeless tobacco: Not on file  . Alcohol use Yes     Comment: socially  . Drug use: No  . Sexual activity: Not on file   Other Topics Concern  . Not on file   Social History Narrative  . No narrative on file     Review of Systems General:  No chills, fever, night sweats or weight changes.  Cardiovascular:  No dyspnea on exertion, edema, orthopnea, palpitations, paroxysmal nocturnal dyspnea. + CP Dermatological: No rash, lesions/masses Respiratory: No cough, dyspnea Urologic: No hematuria, dysuria Abdominal:   No nausea, vomiting, diarrhea, bright red blood per rectum, melena, or hematemesis + abdominal pain Neurologic:  No visual changes, wkns, changes in mental status. All other systems reviewed and are otherwise negative except as noted above.  Physical Exam  Blood pressure (!) 140/97, pulse (!) 47, temperature 98 F (36.7 C), temperature source Oral, resp. rate 17, SpO2 100 %.   GEN:No acute distress.   Neck:No JVD Cardiac:RRR, no murmurs, rubs, or gallops.  Respiratory:Clear to auscultation bilaterally. JS:EGBT, nontender, non-distended  MS:No edema; No deformity. Neuro:Nonfocal  Psych: Normal affect    No results for input(s): CKTOTAL, CKMB, TROPONINI in the last 72 hours. Lab Results  Component Value Date   WBC 8.5 05/31/2017   HGB 14.0 05/31/2017   HCT 40.8 05/31/2017   MCV 91.5 05/31/2017   PLT 237 05/31/2017     Recent Labs Lab 05/31/17 1221 05/31/17 1326  NA 139  --   K 4.4  --   CL 110  --   CO2 22  --   BUN 15  --   CREATININE 1.03  --   CALCIUM 9.3  --   PROT  --  6.6  BILITOT  --  0.7  ALKPHOS  --  65  ALT  --  25  AST  --  22  GLUCOSE 101*  --    Lab Results  Component Value Date   CHOL 114 05/22/2017   HDL 33 (L) 05/22/2017   LDLCALC 59 05/22/2017   TRIG 111 05/22/2017   No results found for: DDIMER     Radiology/Studies  LHC: 05/21/17  Conclusion     Mid LAD lesion, 90 %stenosed. A STENT SYNERGY DES 3.5X16 drug eluting stent was successfully placed, postdilated to 3.8 mm.  Mid Cx-2 lesion, 90 %stenosed. A STENT SYNERGY DES 3.5X20 drug eluting stent was successfully placed, postdilated to 3.8 mm in diameter.  Post intervention, there is a 0% residual stenosis.  2nd Diag lesion, 95 %stenosed. This was treated with balloon angioplasty with a 2.5 mm balloon.  Post intervention, there is a 20% residual stenosis.  The left ventricular ejection fraction is 50-55% by visual estimate.  The left ventricular systolic function is normal.  LV end diastolic pressure is normal.  There is no aortic valve stenosis.  Continue dual antiplatelet therapy for at least one year without interruption. Patient with prior gastric surgery for bleeding ulcer so SYNERGY stents were placed in the event there are issues with antiplatelet therapy absorption, or bleeding.  He will need to stop smoking and will need high dose lipid lowering therapy.      Dg Chest 2 View  Result Date: 05/31/2017 CLINICAL DATA:  Chest pain. EXAM: CHEST  2 VIEW COMPARISON:  05/21/2017 FINDINGS: Patient rotated minimally left on the frontal. Midline trachea. Normal heart size. Tortuous thoracic aorta. Atherosclerosis in the transverse aorta. No pleural effusion or pneumothorax. Calcified granulomas in the right upper lobe. Surgical changes at the gastroesophageal junction. IMPRESSION: No acute cardiopulmonary disease. Aortic Atherosclerosis (ICD10-I70.0). Electronically Signed   By: Abigail Miyamoto M.D.   On: 05/31/2017 12:56   Dg Chest 2 View  Result Date: 05/21/2017 CLINICAL DATA:  Chest and back pain for 3 hours. Hypertension. Smoker. EXAM: CHEST  2 VIEW COMPARISON:  None. FINDINGS: Normal heart size and pulmonary vascularity. Mediastinal contours are intact. Calcified and tortuous aorta. Vague rounded density over the right  upper chest probably represents an EKG lead. No airspace disease or consolidation in the lungs. Calcified granulomas in the right lung. No blunting of costophrenic angles. No pneumothorax. Mild degenerative changes in the spine. IMPRESSION: No active cardiopulmonary disease. Electronically Signed   By: Lucienne Capers M.D.   On: 05/21/2017 04:00   Ct Angio Chest/abd/pel For Dissection W And/or Wo Contrast  Result Date: 05/21/2017 CLINICAL DATA:  Midchest pain radiating to the back for 3 hours. EXAM: CT ANGIOGRAPHY CHEST, ABDOMEN AND PELVIS TECHNIQUE: Multidetector CT imaging through the chest, abdomen and pelvis was performed using the standard protocol during bolus administration of intravenous contrast. Multiplanar reconstructed images and MIPs were obtained and reviewed to evaluate the vascular anatomy. CONTRAST:  100 mL Isovue 370 COMPARISON:  None. FINDINGS: CTA CHEST FINDINGS Cardiovascular: Preferential opacification of the thoracic aorta. No evidence of thoracic aortic aneurysm or dissection. Normal heart size. No pericardial effusion. The pulmonary arteries are also well opacified, with no emboli. Mediastinum/Nodes: No enlarged mediastinal, hilar, or axillary lymph nodes. Thyroid gland, trachea, and esophagus demonstrate no significant findings. Lungs/Pleura: Lungs are clear. No pleural effusion or pneumothorax. Incidental calcified granulomata. Musculoskeletal: No significant skeletal lesions. Review of the MIP images confirms the above findings. CTA ABDOMEN AND PELVIS FINDINGS VASCULAR Aorta: Normal caliber aorta without aneurysm, dissection, vasculitis or significant stenosis. Moderate atherosclerotic calcifications and nonstenotic plaque. Celiac: Patent without evidence of aneurysm, dissection, vasculitis or significant stenosis. SMA: Patent without evidence of aneurysm, dissection, vasculitis or significant stenosis. Renals: Both renal arteries are patent without evidence of aneurysm, dissection,  vasculitis, fibromuscular dysplasia or significant stenosis. IMA: Patent without evidence of aneurysm, dissection, vasculitis or significant stenosis. Inflow: Patent without evidence of aneurysm, dissection, vasculitis or significant stenosis. Veins: No obvious venous abnormality within the limitations of this arterial phase study. Review of the MIP images confirms the above findings. NON-VASCULAR Hepatobiliary: No focal liver abnormality is seen. No gallstones, gallbladder wall thickening, or biliary dilatation. Pancreas: Unremarkable. No pancreatic ductal dilatation or surrounding inflammatory changes. Spleen: Normal in size without focal abnormality. Adrenals/Urinary Tract: Adrenal glands are unremarkable. Kidneys are normal, without renal calculi, focal lesion, or hydronephrosis. Bladder is unremarkable. Stomach/Bowel: Stomach has been partially resected, probably with gastrojejunostomy. Small bowel is otherwise unremarkable. Appendix is normal. No evidence of bowel wall thickening, distention, or inflammatory  changes. Lymphatic: No adenopathy in the abdomen or pelvis. Reproductive: Unremarkable Other: No focal inflammation.  No ascites. Musculoskeletal: No significant skeletal lesion. Moderately severe lumbar degenerative disc changes at L3-4 and L4-5. Review of the MIP images confirms the above findings. IMPRESSION: 1. Aortic atherosclerosis.  No evidence of acute aortic syndrome. 2. No acute findings are evident in the chest, abdomen or pelvis. Electronically Signed   By: Andreas Newport M.D.   On: 05/21/2017 05:15    ECG  HR 75, sinus rhythm with occasional PVCs  ASSESSMENT AND PLAN  1. Chest pain with known coronary artery disease s/p LHC on 05/21/2017 for NSTEMI:   -- DES to mid LAD and Mid Cx-2. EF 50-55% -- started on Brilinta and ASA, he has been advised to no longer smoke and started on high dose lipid lowering therapy. -- has new t-wave inversion in inferior leads, new since discharge --  two negative troponins, recommend admission overnight for observation and to continue to cycle troponins. I do not feel that any intervention is required at this time unless he has ischemic EKG changes, persistent CP or bumps a troponin level. --  Cont Metoprolol from 12.5 mg BID, unable to titrate further as pulse is 40-50s. -- will start Imdur 30 mg daily. -- cycle enzymes and repeat EKG in AM.  2. Smoking discussion: Has decreased from 1 pack per day to 2 cigarettes per day  3. Hyperlipidemia: Continue statin, 80 mg daily  4. Hypertension: BP ranging from 115/95- 149/96.  SignedLinus Mako, PA-C 05/31/2017, 6:17 PM  Patient seen and examined and history reviewed. Agree with above findings and plan. 56 yo WM with recently diagnosed CAD. Presented in July with NSTEMI. Troponin peak 5.37. Ecg without acute changes. He underwent cardiac cath on 05/21/17 showing 2 vessel CAD with high grade lesions in the mid LAD, second diagonal- diffusely diseased, and mid LCx. The LAD and LCx were successfully stented with DES. The diagonal was treated with POBA. Since his procedure he reports he hasn't felt all that well. Has some persistent dyspnea and vague pain. Today noted exertional epigastric and back pain similar to initial symptoms in July. Took sl Ntg with some relief. Now is pain free. Reports compliance with meds.   On exam he is in no distress. Pulse in 50s. BP controlled Lungs clear. CV RRR without gallop, murmur or click. Abd. Soft NT Right forearm with extensive bruising with firm area over right radial cath site.   Ecg shows NSR with new T wave inversions in inferior leads.  I stat Troponin negative x 2.  Impression: 1. Chest pain. Similar to prior anginal symptoms. Doubt acute stent thrombosis since this would result in more profound angina and ST changes. He may have early restenosis of the diagonal since it was small and diffusely diseased. At this point will admit for overnight  observation. Cycle troponin levels and Ecg. Will add long-acting nitrates with Imdur 30 mg daily. Unable to increase beta blocker due to bradycardia. Some of his dyspnea may be related to Brilinta. May need to consider switching him to Plavix.  2. HTN 3. HLD 4. Tobacco use- continue cessation.  Lucas Norton, Lucas Norton 05/31/2017 6:40 PM

## 2017-05-31 NOTE — Telephone Encounter (Signed)
New message    Pt is calling to ask about stomach and back pain. He said he took a nitro for the pain and the back pain went away, not the stomach pain. Please call.

## 2017-05-31 NOTE — Telephone Encounter (Signed)
Information discussed with Dr. Burt Knack and he advised that patient needed to be evaluated at the ED.   Patient informed and verbalized understanding of plan.

## 2017-05-31 NOTE — Telephone Encounter (Signed)
Patient c/o intermittent mid abdomen pain that he's had prior to his hospital admission. Patient said today after walking for exercise, he developed an constant, aching and tightening pain that started in his mid abdomen and radiated to his mid back rated 8/10. Patient said he took a nitroglycerin and the pain improved and now rated 3/10. Patient said the pain has returned in his back and now rated 5/10. Patient also c/o headache and some sob which is the same as before heart attack. No c/o n/v, diarrhea, fever, chills, chest pain. Patient advised that message would be taken to the DOD for advice and if his symptoms got worse before he was contacted back to go to the ED for an evaluation. Patient verbalized understanding of plan.

## 2017-06-01 ENCOUNTER — Encounter (HOSPITAL_COMMUNITY): Payer: Self-pay | Admitting: *Deleted

## 2017-06-01 ENCOUNTER — Other Ambulatory Visit: Payer: Self-pay

## 2017-06-01 ENCOUNTER — Telehealth: Payer: Self-pay | Admitting: Physician Assistant

## 2017-06-01 DIAGNOSIS — R079 Chest pain, unspecified: Secondary | ICD-10-CM | POA: Diagnosis not present

## 2017-06-01 LAB — TROPONIN I
Troponin I: <0.03 ng/mL (ref <0.03)
Troponin I: <0.03 ng/mL (ref <0.03)

## 2017-06-01 MED ORDER — CLOPIDOGREL BISULFATE 75 MG PO TABS: 75.0000 mg | ORAL_TABLET | Freq: Every day | ORAL | 11 refills | Status: DC

## 2017-06-01 MED ORDER — ISOSORBIDE MONONITRATE ER 30 MG PO TB24: 30.0000 mg | ORAL_TABLET | Freq: Every day | ORAL | 6 refills | Status: DC

## 2017-06-01 MED ORDER — CLOPIDOGREL BISULFATE 75 MG PO TABS
300.0000 mg | ORAL_TABLET | Freq: Once | ORAL | Status: AC
  Administered 2017-06-01: 300 mg via ORAL
  Filled 2017-06-01: qty 4

## 2017-06-01 NOTE — Progress Notes (Signed)
Progress Note  Patient Name: Lucas Norton Date of Encounter: 06/01/2017  Primary Cardiologist: Johnsie Cancel  Subjective   Feels well this am. No recurrent chest pain or SOB.   Inpatient Medications    Scheduled Meds: . aspirin  81 mg Oral Daily  . atorvastatin  80 mg Oral q1800  . clopidogrel  300 mg Oral Once  . isosorbide mononitrate  30 mg Oral Daily  . metoprolol tartrate  12.5 mg Oral BID  . nicotine  21 mg Transdermal Daily  . sodium chloride flush  3 mL Intravenous Q12H   Continuous Infusions: . sodium chloride     PRN Meds: sodium chloride, acetaminophen, ALPRAZolam, nitroGLYCERIN, ondansetron (ZOFRAN) IV, sodium chloride flush, zolpidem   Vital Signs    Vitals:   06/01/17 0400 06/01/17 0500 06/01/17 0601 06/01/17 0735  BP: (!) 119/93 (!) 117/102 (!) 126/93 (!) 156/93  Pulse: (!) 53 (!) 45 (!) 47 (!) 53  Resp: 10 14 15 16   Temp:    97.8 F (36.6 C)  TempSrc:    Oral  SpO2: 97% 97% 99% 100%  Weight:    176 lb 2.4 oz (79.9 kg)  Height:    5\' 10"  (1.778 m)    Intake/Output Summary (Last 24 hours) at 06/01/17 0956 Last data filed at 06/01/17 0900  Gross per 24 hour  Intake                0 ml  Output                0 ml  Net                0 ml   Filed Weights   06/01/17 0735  Weight: 176 lb 2.4 oz (79.9 kg)    Telemetry    Sinus brady - Personally Reviewed  ECG    Sinus brady rate 50. T wave inversion inferolaterally. - Personally Reviewed  Physical Exam   GEN: No acute distress.   Neck: No JVD Cardiac: RRR, no murmurs, rubs, or gallops.  Respiratory: Clear to auscultation bilaterally. GI: Soft, nontender, non-distended  MS: No edema; No deformity. Bruising right forearm. Neuro:  Nonfocal  Psych: Normal affect   Labs    Chemistry Recent Labs Lab 05/31/17 1221 05/31/17 1326  NA 139  --   K 4.4  --   CL 110  --   CO2 22  --   GLUCOSE 101*  --   BUN 15  --   CREATININE 1.03  --   CALCIUM 9.3  --   PROT  --  6.6  ALBUMIN  --   4.1  AST  --  22  ALT  --  25  ALKPHOS  --  65  BILITOT  --  0.7  GFRNONAA >60  --   GFRAA >60  --   ANIONGAP 7  --      Hematology Recent Labs Lab 05/31/17 1221  WBC 8.5  RBC 4.46  HGB 14.0  HCT 40.8  MCV 91.5  MCH 31.4  MCHC 34.3  RDW 12.9  PLT 237    Cardiac Enzymes Recent Labs Lab 05/31/17 1935 06/01/17 0034 06/01/17 0635  TROPONINI <0.03 <0.03 <0.03    Recent Labs Lab 05/31/17 1236 05/31/17 1619  TROPIPOC 0.02 0.01     BNPNo results for input(s): BNP, PROBNP in the last 168 hours.   DDimer No results for input(s): DDIMER in the last 168 hours.   Radiology    Dg  Chest 2 View  Result Date: 05/31/2017 CLINICAL DATA:  Chest pain. EXAM: CHEST  2 VIEW COMPARISON:  05/21/2017 FINDINGS: Patient rotated minimally left on the frontal. Midline trachea. Normal heart size. Tortuous thoracic aorta. Atherosclerosis in the transverse aorta. No pleural effusion or pneumothorax. Calcified granulomas in the right upper lobe. Surgical changes at the gastroesophageal junction. IMPRESSION: No acute cardiopulmonary disease. Aortic Atherosclerosis (ICD10-I70.0). Electronically Signed   By: Abigail Miyamoto M.D.   On: 05/31/2017 12:56    Cardiac Studies   None   Patient Profile     56 y.o. male with recent NSTEMI and multivessel PCI presents with recurrent epigastric and back pain similar to prior episode.  Assessment & Plan    1. Chest pain. Similar to prior anginal symptoms. Doubt acute stent thrombosis since this would result in more profound angina and ST changes and troponin elevation. He may have early restenosis of the diagonal since it was small and diffusely diseased. Troponin levels all negative.  Will add long-acting nitrates with Imdur 30 mg daily. Unable to increase beta blocker due to bradycardia. Some of his dyspnea may be related to Brilinta. We will switch him to  Plavix. He is stable for DC today. Will arrange TOCM follow up.  2. HTN 3. HLD 4. Tobacco use-  continue cessation.  Signed, Levia Waltermire [place], MD  06/01/2017, 9:56 AM

## 2017-06-01 NOTE — Telephone Encounter (Signed)
New message      TCM appt on 06-07-17 at 2pm with Dayna Dunn per [facility]

## 2017-06-01 NOTE — Discharge Summary (Signed)
Discharge Summary    Patient ID: Lucas Norton,  MRN: [id number], DOB/AGE: [birth year] 56 y.o.  Admit date: 05/31/2017 Discharge date: 06/01/2017  Primary Care Provider: Patient, No Pcp Per Primary Cardiologist: Dr. Johnsie Cancel   Discharge Diagnoses    Principal Problem:   Chest pain Active Problems:   Tobacco abuse   NSTEMI (non-ST elevated myocardial infarction) (HCC)   HTN (hypertension)   HLD (hyperlipidemia)   Allergies No Known Allergies  Diagnostic Studies/Procedures    None    History of Present Illness     56 year old man with tobacco abuse, HTN, HLD with recent NSTEMI presented with recurrent chest pain.   Admitted 7/29-7/30 with NSTEMI. Underwent LHC with Dr. Irish Lack  with PCI/DES to [place], and mLcx. EF noted at 50-55% by LV gram. Discharged on ASA and Brillinta on 7/30.  On August 3 there was concern about his radial catheter site and a right radial duplex ordered resulting in no thrombus and no pseudoaneurysm.   He called this office AM of 05/31/17 complaining of abdominal pain and back pain. He took nitroglycerin and his back pain pain alleviated but the abdominal pain continued. The symptoms started in the context of walking for exercise. He described it as constant, aching, tightening from mid abdomen to mid back with associated SOB. He said that this pains were consistent with those he had before his recent MI. Dr. Burt Knack advised patient to come to ER for evaluation.   In ER, CBC and BMP within normal limits. Troponin is negative x 2, chest ray showing no acute changes from previous admission. New T-wave inversions in the inferior leads. He has decreased his smoking significantly, reports being compliant with his medications. Currently pain free.   Hospital Course     Consultants: None  Admitted with symptoms similar to prior angina. Troponin x 3 negative. Remained chest pain free overnight. Doubt acute stent thrombosis since this would result in more profound  angina and ST changes and troponin elevation. He may have early restenosis of the diagonal since it was small and diffusely diseased.  Will add long-acting nitrates with Imdur 30 mg daily. Unable to increase beta blocker due to bradycardia. Some of his dyspnea may be related to Brilinta switch to  Plavix (loading dose given in hospital).  TOCM rescheduled.   The patient has been seen by Dr. Martinique today and deemed ready for discharge home. All follow-up appointments have been scheduled. Discharge medications are listed below.    Discharge Vitals Blood pressure (!) 156/93, pulse (!) 53, temperature 97.8 F (36.6 C), temperature source Oral, resp. rate 16, height 5\' 10"  (1.778 m), weight 176 lb 2.4 oz (79.9 kg), SpO2 100 %.  Filed Weights   06/01/17 0735  Weight: 176 lb 2.4 oz (79.9 kg)    Labs & Radiologic Studies     CBC  Recent Labs  05/31/17 1221  WBC 8.5  HGB 14.0  HCT 40.8  MCV 91.5  PLT 518   Basic Metabolic Panel  Recent Labs  05/31/17 1221  NA 139  K 4.4  CL 110  CO2 22  GLUCOSE 101*  BUN 15  CREATININE 1.03  CALCIUM 9.3   Liver Function Tests  Recent Labs  05/31/17 1326  AST 22  ALT 25  ALKPHOS 65  BILITOT 0.7  PROT 6.6  ALBUMIN 4.1    Recent Labs  05/31/17 1326  LIPASE 25   Cardiac Enzymes  Recent Labs  05/31/17 1935 06/01/17 0034 06/01/17  Galien <0.03 <0.03 <0.03     Dg Chest 2 View  Result Date: 05/31/2017 CLINICAL DATA:  Chest pain. EXAM: CHEST  2 VIEW COMPARISON:  05/21/2017 FINDINGS: Patient rotated minimally left on the frontal. Midline trachea. Normal heart size. Tortuous thoracic aorta. Atherosclerosis in the transverse aorta. No pleural effusion or pneumothorax. Calcified granulomas in the right upper lobe. Surgical changes at the gastroesophageal junction. IMPRESSION: No acute cardiopulmonary disease. Aortic Atherosclerosis (ICD10-I70.0). Electronically Signed   By: Abigail Miyamoto M.D.   On: 05/31/2017 12:56   Dg Chest  2 View  Result Date: 05/21/2017 CLINICAL DATA:  Chest and back pain for 3 hours. Hypertension. Smoker. EXAM: CHEST  2 VIEW COMPARISON:  None. FINDINGS: Normal heart size and pulmonary vascularity. Mediastinal contours are intact. Calcified and tortuous aorta. Vague rounded density over the right upper chest probably represents an EKG lead. No airspace disease or consolidation in the lungs. Calcified granulomas in the right lung. No blunting of costophrenic angles. No pneumothorax. Mild degenerative changes in the spine. IMPRESSION: No active cardiopulmonary disease. Electronically Signed   By: Lucienne Capers M.D.   On: 05/21/2017 04:00   Ct Angio Chest/abd/pel For Dissection W And/or Wo Contrast  Result Date: 05/21/2017 CLINICAL DATA:  Midchest pain radiating to the back for 3 hours. EXAM: CT ANGIOGRAPHY CHEST, ABDOMEN AND PELVIS TECHNIQUE: Multidetector CT imaging through the chest, abdomen and pelvis was performed using the standard protocol during bolus administration of intravenous contrast. Multiplanar reconstructed images and MIPs were obtained and reviewed to evaluate the vascular anatomy. CONTRAST:  100 mL Isovue 370 COMPARISON:  None. FINDINGS: CTA CHEST FINDINGS Cardiovascular: Preferential opacification of the thoracic aorta. No evidence of thoracic aortic aneurysm or dissection. Normal heart size. No pericardial effusion. The pulmonary arteries are also well opacified, with no emboli. Mediastinum/Nodes: No enlarged mediastinal, hilar, or axillary lymph nodes. Thyroid gland, trachea, and esophagus demonstrate no significant findings. Lungs/Pleura: Lungs are clear. No pleural effusion or pneumothorax. Incidental calcified granulomata. Musculoskeletal: No significant skeletal lesions. Review of the MIP images confirms the above findings. CTA ABDOMEN AND PELVIS FINDINGS VASCULAR Aorta: Normal caliber aorta without aneurysm, dissection, vasculitis or significant stenosis. Moderate atherosclerotic  calcifications and nonstenotic plaque. Celiac: Patent without evidence of aneurysm, dissection, vasculitis or significant stenosis. SMA: Patent without evidence of aneurysm, dissection, vasculitis or significant stenosis. Renals: Both renal arteries are patent without evidence of aneurysm, dissection, vasculitis, fibromuscular dysplasia or significant stenosis. IMA: Patent without evidence of aneurysm, dissection, vasculitis or significant stenosis. Inflow: Patent without evidence of aneurysm, dissection, vasculitis or significant stenosis. Veins: No obvious venous abnormality within the limitations of this arterial phase study. Review of the MIP images confirms the above findings. NON-VASCULAR Hepatobiliary: No focal liver abnormality is seen. No gallstones, gallbladder wall thickening, or biliary dilatation. Pancreas: Unremarkable. No pancreatic ductal dilatation or surrounding inflammatory changes. Spleen: Normal in size without focal abnormality. Adrenals/Urinary Tract: Adrenal glands are unremarkable. Kidneys are normal, without renal calculi, focal lesion, or hydronephrosis. Bladder is unremarkable. Stomach/Bowel: Stomach has been partially resected, probably with gastrojejunostomy. Small bowel is otherwise unremarkable. Appendix is normal. No evidence of bowel wall thickening, distention, or inflammatory changes. Lymphatic: No adenopathy in the abdomen or pelvis. Reproductive: Unremarkable Other: No focal inflammation.  No ascites. Musculoskeletal: No significant skeletal lesion. Moderately severe lumbar degenerative disc changes at L3-4 and L4-5. Review of the MIP images confirms the above findings. IMPRESSION: 1. Aortic atherosclerosis.  No evidence of acute aortic syndrome. 2. No acute findings are evident in  the chest, abdomen or pelvis. Electronically Signed   By: Andreas Newport M.D.   On: 05/21/2017 05:15    Disposition   Pt is being discharged home today in good condition.  Follow-up Plans &  Appointments    Follow-up Information    Charlie Pitter, PA-C. Go on 06/07/2017.   Specialties:  Cardiology, Radiology Why:  @2pm  for TCM Contact information: [address] [place] [address] [phone]          Discharge Instructions    Diet - low sodium heart healthy    Complete by:  As directed    Increase activity slowly    Complete by:  As directed       Discharge Medications   Current Discharge Medication List    START taking these medications   Details  clopidogrel (PLAVIX) 75 MG tablet Take 1 tablet (75 mg total) by mouth daily. Qty: 30 tablet, Refills: 11    isosorbide mononitrate (IMDUR) 30 MG 24 hr tablet Take 1 tablet (30 mg total) by mouth daily. Qty: 30 tablet, Refills: 6      CONTINUE these medications which have NOT CHANGED   Details  aspirin 81 MG chewable tablet Chew 1 tablet (81 mg total) by mouth daily. Qty: 30 tablet, Refills: 0    atorvastatin (LIPITOR) 80 MG tablet Take 1 tablet (80 mg total) by mouth daily at 6 PM. Qty: 90 tablet, Refills: 1    metoprolol tartrate (LOPRESSOR) 25 MG tablet Take 0.5 tablets (12.5 mg total) by mouth 2 (two) times daily. Qty: 60 tablet, Refills: 2    nicotine (NICODERM CQ - DOSED IN MG/24 HOURS) 21 mg/24hr patch Place 1 patch (21 mg total) onto the skin daily. Qty: 28 patch, Refills: 0    nitroGLYCERIN (NITROSTAT) 0.4 MG SL tablet Place 1 tablet (0.4 mg total) under the tongue every 5 (five) minutes as needed. Qty: 25 tablet, Refills: 3      STOP taking these medications     ticagrelor (BRILINTA) 90 MG TABS tablet          Outstanding Labs/Studies   LFT and lipid panel in 6 weeks.   Duration of Discharge Encounter   Greater than 30 minutes including physician time.  Signed, Shekera Beavers PA-C 06/01/2017, 10:12 AM

## 2017-06-01 NOTE — ED Provider Notes (Signed)
[facility] DEPT Provider Note   CSN: [id number] Arrival date & time: 05/31/17  1158     History   Chief Complaint Chief Complaint  Patient presents with  . Chest Pain    HPI Lucas Norton is a 56 y.o. male. Pleasant 45 showed male with no known history of coronary artery disease until he presented with an STEMI on 7/29. Underwent coronary angiography with PTCA in 2 drug-eluting stents to circumflex, and LAD. Had another lesion of LAD 2 that underwent angioplasty but could not have stent passage or deployment. Discharged on aspirin Brilinta which she is compliant with. Has significantly decreased his smoking. He has had some abdominal and back discomfort since his discharge and can ambulate less than 5 minutes without shortness of breath. He does not have classic anginal type chest pain. However today after walking less than 2-3 minutes he was limited by shortness of breath back pain and abdominal pain and took nitroglycerin 3 with improvement in initial resolution of his pain. He is pain-free upon arrival here. He did take his third nitroglycerin while in the waiting room as I examine him in the room he is pain-free  HPI  Past Medical History:  Diagnosis Date  . Back pain   . Current every day smoker   . HLD (hyperlipidemia)   . Hypercholesteremia   . Hypertension   . NSTEMI (non-ST elevated myocardial infarction) ([facility]) 05/21/2017   05/21/17 PCI/DES to mLAD, mLcx. EF normal    Patient Active Problem List   Diagnosis Date Noted  . Chest pain 05/31/2017  . Tobacco abuse 05/21/2017  . NSTEMI (non-ST elevated myocardial infarction) ([facility]) 05/21/2017  . HTN (hypertension) 05/21/2017  . HLD (hyperlipidemia) 05/21/2017    Past Surgical History:  Procedure Laterality Date  . BACK SURGERY    . CORONARY BALLOON ANGIOPLASTY N/A 05/21/2017   Procedure: Coronary Balloon Angioplasty;  Surgeon: Jettie Booze, MD;  Location: [facility];  Service: Cardiovascular;   Laterality: N/A;  Mid Diag  . CORONARY STENT INTERVENTION N/A 05/21/2017   Procedure: Coronary Stent Intervention;  Surgeon: Jettie Booze, MD;  Location: [facility];  Service: Cardiovascular;  Laterality: N/A;  Mid LAD,Mid CFX  . LEFT HEART CATH AND CORONARY ANGIOGRAPHY N/A 05/21/2017   Procedure: Left Heart Cath and Coronary Angiography;  Surgeon: Jettie Booze, MD;  Location: [facility];  Service: Cardiovascular;  Laterality: N/A;  . PARTIAL GASTRECTOMY         Home Medications    Prior to Admission medications   Medication Sig Start Date End Date Taking? Authorizing Provider  aspirin 81 MG chewable tablet Chew 1 tablet (81 mg total) by mouth daily. 05/23/17  Yes Cheryln Manly, NP  atorvastatin (LIPITOR) 80 MG tablet Take 1 tablet (80 mg total) by mouth daily at 6 PM. 05/22/17  Yes Cheryln Manly, NP  metoprolol tartrate (LOPRESSOR) 25 MG tablet Take 0.5 tablets (12.5 mg total) by mouth 2 (two) times daily. 05/22/17  Yes Reino Bellis B, NP  nicotine (NICODERM CQ - DOSED IN MG/24 HOURS) 21 mg/24hr patch Place 1 patch (21 mg total) onto the skin daily. 05/23/17  Yes Cheryln Manly, NP  nitroGLYCERIN (NITROSTAT) 0.4 MG SL tablet Place 1 tablet (0.4 mg total) under the tongue every 5 (five) minutes as needed. 05/22/17  Yes Cheryln Manly, NP  ticagrelor (BRILINTA) 90 MG TABS tablet Take 1 tablet (90 mg total) by mouth 2 (two) times daily. 05/22/17  Yes  Cheryln Manly, NP  clopidogrel (PLAVIX) 75 MG tablet Take 1 tablet (75 mg total) by mouth daily. 06/01/17 06/01/18  Leanor Kail, PA  isosorbide mononitrate (IMDUR) 30 MG 24 hr tablet Take 1 tablet (30 mg total) by mouth daily. 06/02/17   Leanor Kail, PA    Family History Family History  Problem Relation Age of Onset  . Hypertension Father     Social History Social History  Substance Use Topics  . Smoking status: Current Every Day Smoker    Packs/day: 1.00    Years: 20.00     Types: Cigarettes  . Smokeless tobacco: Never Used  . Alcohol use Yes     Comment: socially     Allergies   Patient has no known allergies.   Review of Systems Review of Systems  Constitutional: Negative for appetite change, chills, diaphoresis, fatigue and fever.  HENT: Negative for mouth sores, sore throat and trouble swallowing.   Eyes: Negative for visual disturbance.  Respiratory: Positive for shortness of breath. Negative for cough, chest tightness and wheezing.   Cardiovascular: Negative for chest pain.  Gastrointestinal: Positive for abdominal pain. Negative for abdominal distention, diarrhea, nausea and vomiting.  Endocrine: Negative for polydipsia, polyphagia and polyuria.  Genitourinary: Negative for dysuria, frequency and hematuria.  Musculoskeletal: Positive for back pain. Negative for gait problem.  Skin: Negative for color change, pallor and rash.  Neurological: Negative for dizziness, syncope, light-headedness and headaches.  Hematological: Does not bruise/bleed easily.  Psychiatric/Behavioral: Negative for behavioral problems and confusion.     Physical Exam Updated Vital Signs BP (!) 156/93 (BP Location: Right Arm)   Pulse (!) 53   Temp 97.8 F (36.6 C) (Oral)   Resp 16   Ht 5\' 10"  (1.778 m)   Wt 79.9 kg (176 lb 2.4 oz)   SpO2 100%   BMI 25.27 kg/m   Physical Exam  Constitutional: He is oriented to person, place, and time. He appears well-developed and well-nourished. No distress.  HENT:  Head: Normocephalic.  Eyes: Pupils are equal, round, and reactive to light. Conjunctivae are normal. No scleral icterus.  Neck: Normal range of motion. Neck supple. No thyromegaly present.  Cardiovascular: Normal rate and regular rhythm.  Exam reveals no gallop and no friction rub.   No murmur heard. Pulmonary/Chest: Effort normal and breath sounds normal. No respiratory distress. He has no wheezes. He has no rales.  Abdominal: Soft. Bowel sounds are normal. He  exhibits no distension. There is no tenderness. There is no rebound.  Musculoskeletal: Normal range of motion.  Neurological: He is alert and oriented to person, place, and time.  Skin: Skin is warm and dry. No rash noted.  Psychiatric: He has a normal mood and affect. His behavior is normal.     ED Treatments / Results  Labs (all labs ordered are listed, but only abnormal results are displayed) Labs Reviewed  BASIC METABOLIC PANEL - Abnormal; Notable for the following:       Result Value   Glucose, Bld 101 (*)    All other components within normal limits  CBC  HEPATIC FUNCTION PANEL  LIPASE, BLOOD  TROPONIN I  TROPONIN I  TROPONIN I  I-STAT TROPONIN, ED  I-STAT TROPONIN, ED    EKG  EKG Interpretation None       Radiology Dg Chest 2 View  Result Date: 05/31/2017 CLINICAL DATA:  Chest pain. EXAM: CHEST  2 VIEW COMPARISON:  05/21/2017 FINDINGS: Patient rotated minimally left on the frontal. Midline trachea.  Normal heart size. Tortuous thoracic aorta. Atherosclerosis in the transverse aorta. No pleural effusion or pneumothorax. Calcified granulomas in the right upper lobe. Surgical changes at the gastroesophageal junction. IMPRESSION: No acute cardiopulmonary disease. Aortic Atherosclerosis (ICD10-I70.0). Electronically Signed   By: Abigail Miyamoto M.D.   On: 05/31/2017 12:56    Procedures Procedures (including critical care time)  Medications Ordered in ED Medications  sodium chloride flush (NS) 0.9 % injection 3 mL (3 mLs Intravenous Given 06/01/17 0952)  sodium chloride flush (NS) 0.9 % injection 3 mL (not administered)  0.9 %  sodium chloride infusion (not administered)  nitroGLYCERIN (NITROSTAT) SL tablet 0.4 mg (not administered)  acetaminophen (TYLENOL) tablet 650 mg (not administered)  ondansetron (ZOFRAN) injection 4 mg (not administered)  zolpidem (AMBIEN) tablet 5 mg (not administered)  ALPRAZolam (XANAX) tablet 0.25 mg (not administered)  isosorbide mononitrate  (IMDUR) 24 hr tablet 30 mg (30 mg Oral Given 06/01/17 0952)  aspirin chewable tablet 81 mg (81 mg Oral Given 06/01/17 0951)  atorvastatin (LIPITOR) tablet 80 mg (not administered)  nicotine (NICODERM CQ - dosed in mg/24 hours) patch 21 mg (21 mg Transdermal Not Given 06/01/17 0956)  metoprolol tartrate (LOPRESSOR) tablet 12.5 mg (12.5 mg Oral Given 06/01/17 0951)  clopidogrel (PLAVIX) tablet 300 mg (not administered)     Initial Impression / Assessment and Plan / ED Course  I have reviewed the triage vital signs and the nursing notes.  Pertinent labs & imaging results that were available during my care of the patient were reviewed by me and considered in my medical decision making (see chart for details).     EKG shows T-wave inversions and nonspecific ST changes inferiorly. These had resolved at discharge but aren't new now at 6 days status post discharge. First troponin normal. Remains pain-free. I discussed the case with cardiology will see him in the emergency room to discuss further interventions and care.  Final Clinical Impressions(s) / ED Diagnoses   Final diagnoses:  Unstable angina [facility])    New Prescriptions Current Discharge Medication List    START taking these medications   Details  clopidogrel (PLAVIX) 75 MG tablet Take 1 tablet (75 mg total) by mouth daily. Qty: 30 tablet, Refills: 11    isosorbide mononitrate (IMDUR) 30 MG 24 hr tablet Take 1 tablet (30 mg total) by mouth daily. Qty: 30 tablet, Refills: 6         Tanna Furry, MD 06/01/17 1012

## 2017-06-02 ENCOUNTER — Ambulatory Visit: Payer: 59 | Admitting: Cardiology

## 2017-06-02 NOTE — Telephone Encounter (Signed)
**Note De-Identified Lucas Norton Obfuscation** 1st attempted TCM call:  I called both phone numbers listed in the pts chart and got no answer. I left a message on both VM's asking the pt to call me back.

## 2017-06-02 NOTE — Telephone Encounter (Signed)
**Note De-Identified [name] Obfuscation** Patient contacted regarding discharge from [facility] on 06/01/2017.  Patient understands to follow up with provider Melina Copa, PA-c on 06/07/17 at 2:00 pm at [address]. Patient understands discharge instructions? yes Patient understands medications and regiment? yes Patient understands to bring all medications to this visit? yes  The pt states that he is doing well at this time. He has this office's phone number and states that he will call if he has questions or concerns prior to his f/u.

## 2017-06-07 ENCOUNTER — Encounter: Payer: Self-pay | Admitting: Physician Assistant

## 2017-06-07 ENCOUNTER — Ambulatory Visit (INDEPENDENT_AMBULATORY_CARE_PROVIDER_SITE_OTHER): Payer: 59 | Admitting: Physician Assistant

## 2017-06-07 VITALS — BP 132/90 | HR 65 | Ht 70.0 in | Wt 181.0 lb

## 2017-06-07 DIAGNOSIS — E785 Hyperlipidemia, unspecified: Secondary | ICD-10-CM | POA: Diagnosis not present

## 2017-06-07 DIAGNOSIS — I2510 Atherosclerotic heart disease of native coronary artery without angina pectoris: Secondary | ICD-10-CM | POA: Insufficient documentation

## 2017-06-07 DIAGNOSIS — Z720 Tobacco use: Secondary | ICD-10-CM | POA: Diagnosis not present

## 2017-06-07 DIAGNOSIS — I10 Essential (primary) hypertension: Secondary | ICD-10-CM

## 2017-06-07 MED ORDER — LOSARTAN POTASSIUM 25 MG PO TABS: 25.0000 mg | ORAL_TABLET | Freq: Every day | ORAL | 3 refills | Status: DC

## 2017-06-07 NOTE — Progress Notes (Signed)
Cardiology Office Note    Date:  06/07/2017  ID:  Lucas Norton, DOB [birth year], MRN [id number] PCP:  Patient, No Pcp Per  Cardiologist:  Dr. Johnsie Cancel   Chief Complaint: f/u CAD  History of Present Illness:  Lucas Norton is a 56 y.o. male originally from [place] with history of recently diagnosed CAD (NSTEMI 04/2017 s/p DES to mLAD and LCx, PTCA to D2, EF 50-55%), HTN, HLD, tobacco abuse, baseline bradycardia who presents for post-hospital follow-up. To recap recent history, he was admitted 7/29-7/30 with NSTEMI. Underwent LHC with Dr. Irish Lack  with PCI/DES to mLAD, and mLcx, EF noted at 50-55% by LV gram. He was discharged on ASA and Brillinta on 7/30. On August 3 there was concern about his radial catheter site and a right radial duplex ordered resulting in no thrombus and no pseudoaneurysm. He presented back to the hospital 05/31/17 with abdominal pain and back pain similar to recent MI. Troponins were negative, CBC, BMET unremarkable. Acute stent thrombosis was doubted given lack of profound EKG changes. It was suspected he may have early restenosis of diagonal since it was small and diffusely diseased, so Imdur was added. Unable to increase BB due to baseline bradycardia. Brilinta was switched to Plavix due to possible contribution to dyspnea. Labs showed normal CBC, K 4.4, Cr 1.03, CXR NAD, recent LDL 59, lipase wnl.  He returns for follow-up overall feeling tremendously better. No further CP, abdominal pain, SOB. Cath site bruising appears to be healing gradually. He was exercising regularly prior to MI and plans to gradually increase activity. He has been following BP at home and occasionally noticing spikes to the 170 range. Prior to recent MI, he was on losartan/HCTZ as well as amlodipine. He does report a h/o cough with another medication but cannot remember name (I suspect ACEI).  Past Medical History:  Diagnosis Date  . Back pain   . CAD in native artery    a. NSTEMI 04/2017 s/p DES  to mLAD and Cx, PTCA to D2, EF 50-55%.  Marland Kitchen HLD (hyperlipidemia)   . Hypertension   . NSTEMI (non-ST elevated myocardial infarction) ([place]) 05/21/2017   05/21/17 PCI/DES to mLAD, mLcx. EF normal  . Sinus bradycardia    unable to titrate BB further due to this  . Tobacco abuse     Past Surgical History:  Procedure Laterality Date  . BACK SURGERY    . CORONARY BALLOON ANGIOPLASTY N/A 05/21/2017   Procedure: Coronary Balloon Angioplasty;  Surgeon: Jettie Booze, MD;  Location: [facility];  Service: Cardiovascular;  Laterality: N/A;  Mid Diag  . CORONARY STENT INTERVENTION N/A 05/21/2017   Procedure: Coronary Stent Intervention;  Surgeon: Jettie Booze, MD;  Location: [facility];  Service: Cardiovascular;  Laterality: N/A;  Mid LAD,Mid CFX  . LEFT HEART CATH AND CORONARY ANGIOGRAPHY N/A 05/21/2017   Procedure: Left Heart Cath and Coronary Angiography;  Surgeon: Jettie Booze, MD;  Location: [facility];  Service: Cardiovascular;  Laterality: N/A;  . PARTIAL GASTRECTOMY      Current Medications: Current Meds  Medication Sig  . aspirin 81 MG chewable tablet Chew 1 tablet (81 mg total) by mouth daily.  Marland Kitchen atorvastatin (LIPITOR) 80 MG tablet Take 1 tablet (80 mg total) by mouth daily at 6 PM.  . clopidogrel (PLAVIX) 75 MG tablet Take 1 tablet (75 mg total) by mouth daily.  . isosorbide mononitrate (IMDUR) 30 MG 24 hr tablet Take 1 tablet (30 mg total)  by mouth daily.  . metoprolol tartrate (LOPRESSOR) 25 MG tablet Take 0.5 tablets (12.5 mg total) by mouth 2 (two) times daily.  . nitroGLYCERIN (NITROSTAT) 0.4 MG SL tablet Place 1 tablet (0.4 mg total) under the tongue every 5 (five) minutes as needed.     Allergies:   Patient has no known allergies.   Social History   Social History  . Marital status: Married    Spouse name: N/A  . Number of children: N/A  . Years of education: N/A   Social History Main Topics  . Smoking status: Current Every Day  Smoker    Packs/day: 1.00    Years: 20.00    Types: Cigarettes  . Smokeless tobacco: Never Used  . Alcohol use Yes     Comment: socially  . Drug use: No  . Sexual activity: Not Asked   Other Topics Concern  . None   Social History Narrative  . None     Family History:  Family History  Problem Relation Age of Onset  . Hypertension Father     ROS:   Please see the history of present illness. All other systems are reviewed and otherwise negative.    PHYSICAL EXAM:   VS:  BP 122/76   Pulse 65   Ht 5\' 10"  (1.778 m)   Wt 181 lb (82.1 kg)   SpO2 97%   BMI 25.97 kg/m   BMI: Body mass index is 25.97 kg/m. GEN: Well nourished, well developed WM, in no acute distress  HEENT: normocephalic, atraumatic Neck: no JVD, carotid bruits, or masses Cardiac: RRR; no murmurs, rubs, or gallops, no edema  Respiratory:  clear to auscultation bilaterally, normal work of breathing GI: soft, nontender, nondistended, + BS MS: no deformity or atrophy  Skin: warm and dry, no rash. Right radial cath site without mild residual ecchymosis proximally; good pulse. Neuro:  Alert and Oriented x 3, Strength and sensation are intact, follows commands Psych: euthymic mood, full affect  Wt Readings from Last 3 Encounters:  06/07/17 181 lb (82.1 kg)  06/01/17 176 lb 2.4 oz (79.9 kg)  05/21/17 183 lb 6 oz (83.2 kg)      Studies/Labs Reviewed:   EKG:  EKG was not ordered today.  Recent Labs: 05/31/2017: ALT 25; BUN 15; Creatinine, Ser 1.03; Hemoglobin 14.0; Platelets 237; Potassium 4.4; Sodium 139   Lipid Panel    Component Value Date/Time   CHOL 114 05/22/2017 0214   TRIG 111 05/22/2017 0214   HDL 33 (L) 05/22/2017 0214   CHOLHDL 3.5 05/22/2017 0214   VLDL 22 05/22/2017 0214   LDLCALC 59 05/22/2017 0214    Additional studies/ records that were reviewed today include: Summarized above.    ASSESSMENT & PLAN:   1. CAD - doing well on current regimen - feels much better with d/c of  Brilinta and addition of Imdur. Continue current regimen. Discussed gradual activity increase. Given his improvement and recent negative troponins, I feel he can return to work on 06/12/17 if he is feeling well at that time. 2. Essential HTN - BP recheck by me personally was 132/90. He was previously on several other antihypertensives that were discontinued during recent admissions. Wife is concerned due to BP spikes up to the 170 last week. Will add back losartan 25mg  daily. Asked him to monitor BP at home and call if noticing any readings higher than 867 systolic or 80 diastolic, at which time I would increase losartan to 50mg  daily. Prior BMET while  on this medicine was benign. Will check CMET at time of lipid reassessment in 4 weeks. 3. Hyperlipidemia - recheck liver/lipids in 4 weeks. 4. Tobacco abuse - importance of cessation recently reinforced.  Disposition: F/u with Dr. Johnsie Cancel in 3 months.   Medication Adjustments/Labs and Tests Ordered: Current medicines are reviewed at length with the patient today.  Concerns regarding medicines are outlined above. Medication changes, Labs and Tests ordered today are summarized above and listed in the Patient Instructions accessible in Encounters.   Signed, Charlie Pitter, PA-C  06/07/2017 2:24 PM    [address], [place], [place]  [address] Phone: [phone]; Fax: [phone]

## 2017-06-07 NOTE — Patient Instructions (Signed)
Medication Instructions:  Your physician has recommended you make the following change in your medication: 1.) START Losartan 25 mg daily.   Labwork: Your physician recommends that you return for lab work in: 4 weeks for CMET, LIPID PANEL  Testing/Procedures: None Ordered   Follow-Up: Your physician recommends that you schedule a follow-up appointment in: 3 months with Dr. Johnsie Cancel.  Any Other Special Instructions Will Be Listed Below (If Applicable).  Please call us if your blood pressure is greater than 130/80   If you need a refill on your cardiac medications before your next appointment, please call your pharmacy.

## 2017-06-09 ENCOUNTER — Telehealth (HOSPITAL_COMMUNITY): Payer: Self-pay

## 2017-06-09 NOTE — Telephone Encounter (Signed)
I called patient to discuss scheduling for cardiac rehab. Patient declined cardiac rehab. Class times does not work with his schedule. Patient informed that if schedule changes he can contact our office. Referral closed.

## 2017-06-15 ENCOUNTER — Telehealth: Payer: Self-pay | Admitting: Physician Assistant

## 2017-06-15 MED ORDER — LOSARTAN POTASSIUM 50 MG PO TABS: 50.0000 mg | ORAL_TABLET | Freq: Every day | ORAL | 3 refills | Status: DC

## 2017-06-15 NOTE — Telephone Encounter (Signed)
New Message:    Pt said he was told to call  If his blood pressure went up.He says it have been up every day since he was here. Today it is 174/97.Please call to advise.

## 2017-06-15 NOTE — Telephone Encounter (Signed)
I spoke with the pt and he does not have his BP readings with him at this time.  When he called the office earlier his reading today was 174/97.  The pt states that since starting losartan the lowest that he has seen his SBP was 165.  Per documentation in Dayna's 06/07/17 OV note I advised the pt to go ahead and increase Losartan to 50mg  daily.  The pt will take 2 losartan 25mg  tablets daily and continue to monitor BP. The pt will call back into the office in 1-2 weeks with an update on BP.  If BP has improved then the pt will need a new Rx for the 50mg  tablet, if BP remains elevated then the pt will need further advise about medication changes.

## 2017-06-15 NOTE — Telephone Encounter (Signed)
Please thank him for keeping [place] updated! Agree with going ahead with increase in losartan to 50mg  daily. I also wonder if he may benefit from getting back on a little bit of his previous diuretic. When he came into the hospital the first time around in July, he was on full-dose amlodipine, full dose losartan, and HCTZ 25mg  daily. He initially had lower BPs after that event which led to the team discontinuing these. Clearly we are starting to see his pressure begin to creep back up. I'm concerned that losartan 50mg  might not get him to where he needs to be. If his BP remains elevated above 251 systolic this evening or tomorrow AM, would recommend he start HCTZ 12.5mg  daily. Would probably send this in as a 25 mg tablet for #30 so that we can increase it if needed. From there, please ask him to give BP 4-5 days to adjust and if still running >898 systolic, please call us so we can further adjust. If he notices any markedly elevated pressures (such as >170), call sooner. Than you! Oracio Galen PA-C

## 2017-06-16 NOTE — Telephone Encounter (Signed)
Followed up with pt who states he has not taken any BP reading today.  He did increase Losartan to 50 mg today. Asked pt to monitor over the weekend and keep record of BPs.  Informed that I would follow up on Monday to see how those numbers are doing. Advised to call office over the weekend if BP is too high. Patient verbalized understanding and agreeable to plan.

## 2017-06-19 NOTE — Telephone Encounter (Signed)
BPs over the weekend: Saturday   AM:  181/104          155/105  PM:  135/84          156/97 Sunday  Afternoon: 163/100  PM:  163/102 Monday  AM:  144/98  HCTZ 12.5 mg added per Dayna Dunn's note.  He will call me at the end of the week to report how BPs do over the week after adding low dose HCTZ back to his medication regimen. Patient verbalized understanding and agreeable to plan.

## 2017-06-20 MED ORDER — HYDROCHLOROTHIAZIDE 12.5 MG PO CAPS: 12.5000 mg | ORAL_CAPSULE | Freq: Every day | ORAL | 2 refills | Status: DC

## 2017-06-20 NOTE — Addendum Note (Signed)
Addended by: Stanton Kidney on: 06/20/2017 04:49 PM   Modules accepted: Orders

## 2017-06-27 NOTE — Telephone Encounter (Signed)
Reports BPs (since adding HCTZ back): Morning numbers are increased.  Sun: AM 152/93 PM 123/70 Mon: AM 160/97 PM 108/63 PM 117/68 Tues: AM 173/101 PM 122/98  Reviewed with Melina Copa -- orders to change Losartan to to take at bedtime and HCTZ to morning time.

## 2017-06-28 NOTE — Telephone Encounter (Signed)
Advised of Lucas Norton's recommendation (see previous note below) to change when he takes certain medications. He will take Losartan at HS and HCTZ in the morning. Advised to call office if his SBP consistently stays above 130s. Patient verbalized understanding and agreeable to plan.

## 2017-07-05 ENCOUNTER — Other Ambulatory Visit: Payer: 59 | Admitting: *Deleted

## 2017-07-05 DIAGNOSIS — E785 Hyperlipidemia, unspecified: Secondary | ICD-10-CM

## 2017-07-05 DIAGNOSIS — I2510 Atherosclerotic heart disease of native coronary artery without angina pectoris: Secondary | ICD-10-CM

## 2017-07-05 DIAGNOSIS — Z720 Tobacco use: Secondary | ICD-10-CM

## 2017-07-05 DIAGNOSIS — I10 Essential (primary) hypertension: Secondary | ICD-10-CM

## 2017-07-05 LAB — LIPID PANEL
CHOL/HDL RATIO: 3.1 ratio (ref 0.0–5.0)
Cholesterol, Total: 119 mg/dL (ref 100–199)
HDL: 39 mg/dL — AB (ref >39)
LDL Calculated: 65 mg/dL (ref 0–99)
TRIGLYCERIDES: 76 mg/dL (ref 0–149)
VLDL Cholesterol Cal: 15 mg/dL (ref 5–40)

## 2017-07-05 LAB — COMPREHENSIVE METABOLIC PANEL
A/G RATIO: 2.4 — AB (ref 1.2–2.2)
ALBUMIN: 4.1 g/dL (ref 3.5–5.5)
ALT: 19 IU/L (ref 0–44)
AST: 24 IU/L (ref 0–40)
Alkaline Phosphatase: 62 IU/L (ref 39–117)
BILIRUBIN TOTAL: 0.4 mg/dL (ref 0.0–1.2)
BUN / CREAT RATIO: 14 (ref 9–20)
BUN: 14 mg/dL (ref 6–24)
CALCIUM: 9 mg/dL (ref 8.7–10.2)
CHLORIDE: 104 mmol/L (ref 96–106)
CO2: 25 mmol/L (ref 20–29)
Creatinine, Ser: 0.99 mg/dL (ref 0.76–1.27)
GFR calc non Af Amer: 85 mL/min/{1.73_m2} (ref >59)
GFR, EST AFRICAN AMERICAN: 98 mL/min/{1.73_m2} (ref >59)
Globulin, Total: 1.7 g/dL (ref 1.5–4.5)
Glucose: 98 mg/dL (ref 65–99)
Potassium: 4.1 mmol/L (ref 3.5–5.2)
Sodium: 142 mmol/L (ref 134–144)
TOTAL PROTEIN: 5.8 g/dL — AB (ref 6.0–8.5)

## 2017-07-12 ENCOUNTER — Telehealth: Payer: Self-pay | Admitting: Physician Assistant

## 2017-07-12 NOTE — Telephone Encounter (Signed)
Follow up     Pt is returning [facility] for lab results

## 2017-07-13 NOTE — Telephone Encounter (Signed)
Returned pts call and discussed his lab results. See result note. 

## 2017-07-13 NOTE — Telephone Encounter (Signed)
-----   Message from Charlie Pitter, [place] sent at 07/07/2017  4:26 PM EDT ----- Please let patient know overall labs are reassuring. Cholesterol remains at goal with LDL <70. HDL mildly decreased but recommend regular physical activity and healthy diet. Dayna Dunn PA-C

## 2017-08-15 ENCOUNTER — Other Ambulatory Visit: Payer: Self-pay | Admitting: *Deleted

## 2017-08-15 MED ORDER — LOSARTAN POTASSIUM 50 MG PO TABS: 50.0000 mg | ORAL_TABLET | Freq: Every day | ORAL | 2 refills | Status: DC

## 2017-09-04 NOTE — Progress Notes (Signed)
Cardiology Office Note    Date:  09/04/2017  ID:  [name], DOB [birth year], MRN [id number] PCP:  Patient, No Pcp Per  Cardiologist:  Dr. Johnsie Cancel   Chief Complaint: f/u CAD  History of Present Illness:  Taher Vannote is a 56 y.o. male originally from [place] with history of recently diagnosed CAD (NSTEMI 04/2017 s/p DES to mLAD and LCx, PTCA to D2, EF 50-55%), HTN, HLD, tobacco abuse, baseline bradycardia He was admitted 7/29-7/30 with NSTEMI. Underwent LHC with Dr. Irish Lack  with PCI/DES to mLAD, and mLcx, EF noted at 50-55% by LV gram. He was discharged on ASA and Brillinta on 7/30. On August 3 there was concern about his radial catheter site and a right radial duplex ordered resulting in no thrombus and no pseudoaneurysm. He presented back to the hospital 05/31/17 with abdominal pain and back pain similar to recent MI. Troponins were negative, CBC, BMET unremarkable. Acute stent thrombosis was doubted given lack of profound EKG changes. It was suspected he may have early restenosis of diagonal since it was small and diffusely diseased, so Imdur was added. Unable to increase BB due to baseline bradycardia. Brilinta was switched to Plavix due to possible contribution to dyspnea. Labs showed normal CBC, K 4.4, Cr 1.03, CXR NAD, recent LDL 59, lipase wnl.  BP has been increasing after d/c and ARB/diuretic added back   No chest pain dyspnea Compliant with meds   Past Medical History:  Diagnosis Date  . Back pain   . CAD in native artery    a. NSTEMI 04/2017 s/p DES to mLAD and Cx, PTCA to D2, EF 50-55%.  Marland Kitchen HLD (hyperlipidemia)   . Hypertension   . NSTEMI (non-ST elevated myocardial infarction) ([facility] [facility]) 05/21/2017   05/21/17 PCI/DES to mLAD, mLcx. EF normal  . Sinus bradycardia    unable to titrate BB further due to this  . Tobacco abuse     Past Surgical History:  Procedure Laterality Date  . BACK SURGERY    . PARTIAL GASTRECTOMY      Current Medications: No outpatient  medications have been marked as taking for the 09/11/17 encounter (Appointment) with Josue Hector, MD.     Allergies:   Brilinta [ticagrelor]   Social History   Socioeconomic History  . Marital status: Married    Spouse name: Not on file  . Number of children: Not on file  . Years of education: Not on file  . Highest education level: Not on file  Social Needs  . Financial resource strain: Not on file  . Food insecurity - worry: Not on file  . Food insecurity - inability: Not on file  . Transportation needs - medical: Not on file  . Transportation needs - non-medical: Not on file  Occupational History  . Not on file  Tobacco Use  . Smoking status: Current Every Day Smoker    Packs/day: 1.00    Years: 20.00    Pack years: 20.00    Types: Cigarettes  . Smokeless tobacco: Never Used  Substance and Sexual Activity  . Alcohol use: Yes    Comment: socially  . Drug use: No  . Sexual activity: Not on file  Other Topics Concern  . Not on file  Social History Narrative  . Not on file     Family History:  Family History  Problem Relation Age of Onset  . Hypertension Father     ROS:   Please see the history of present illness. All other  systems are reviewed and otherwise negative.    PHYSICAL EXAM:   VS:  There were no vitals taken for this visit.  BMI: There is no height or weight on file to calculate BMI. Affect appropriate Healthy:  appears stated age 51: normal Neck supple with no adenopathy JVP normal no bruits no thyromegaly Lungs clear with no wheezing and good diaphragmatic motion Heart:  S1/S2 no murmur, no rub, gallop or click PMI normal Abdomen: benighn, BS positve, no tenderness, no AAA no bruit.  No HSM or HJR Distal pulses intact with no bruits No edema Neuro non-focal Skin warm and dry No muscular weakness Right radial sight healed   Wt Readings from Last 3 Encounters:  06/07/17 181 lb (82.1 kg)  06/01/17 176 lb 2.4 oz (79.9 kg)    05/21/17 183 lb 6 oz (83.2 kg)      Studies/Labs Reviewed:   EKG:  06/01/17 SR rate 75 PVC inferior T wave inversions    Recent Labs: 05/31/2017: Hemoglobin 14.0; Platelets 237 07/05/2017: ALT 19; BUN 14; Creatinine, Ser 0.99; Potassium 4.1; Sodium 142   Lipid Panel    Component Value Date/Time   CHOL 119 07/05/2017 0857   TRIG 76 07/05/2017 0857   HDL 39 (L) 07/05/2017 0857   CHOLHDL 3.1 07/05/2017 0857   CHOLHDL 3.5 05/22/2017 0214   VLDL 22 05/22/2017 0214   LDLCALC 65 07/05/2017 0857    Additional studies/ records that were reviewed today include: Summarized above.    ASSESSMENT & PLAN:   1. CAD - stable on medical Rx continue DAT indefinitely  2. Essential HTN -increase cozaar to 100 mg home monitor f/u next available  3. Hyperlipidemia - LDL at goal 65 07/05/17 normal LFTls continue statin  4. Tobacco abuse - importance of cessation recently reinforced. Done to 3-4/day   Disposition: F/u with me in 6 months    Jenkins Rouge

## 2017-09-11 ENCOUNTER — Encounter: Payer: Self-pay | Admitting: Cardiovascular Disease

## 2017-09-11 ENCOUNTER — Encounter (INDEPENDENT_AMBULATORY_CARE_PROVIDER_SITE_OTHER): Payer: Self-pay

## 2017-09-11 ENCOUNTER — Ambulatory Visit (INDEPENDENT_AMBULATORY_CARE_PROVIDER_SITE_OTHER): Payer: 59 | Admitting: Cardiovascular Disease

## 2017-09-11 VITALS — BP 176/116 | HR 54 | Ht 70.0 in | Wt 188.5 lb

## 2017-09-11 DIAGNOSIS — I10 Essential (primary) hypertension: Secondary | ICD-10-CM

## 2017-09-11 MED ORDER — LOSARTAN POTASSIUM 100 MG PO TABS: 100.0000 mg | ORAL_TABLET | Freq: Every day | ORAL | 3 refills | Status: DC

## 2017-09-11 NOTE — Patient Instructions (Addendum)
Medication Instructions:  Your physician has recommended you make the following change in your medication:  1-Increase losartan 100 mg by mouth daily.  Labwork: NONE  Testing/Procedures: NONE  Follow-Up: Your physician wants you to follow-up next available with Dr. Johnsie Cancel.    If you need a refill on your cardiac medications before your next appointment, please call your pharmacy.

## 2017-10-09 ENCOUNTER — Other Ambulatory Visit: Payer: Self-pay

## 2017-10-09 ENCOUNTER — Encounter (HOSPITAL_COMMUNITY): Payer: Self-pay | Admitting: *Deleted

## 2017-10-09 ENCOUNTER — Inpatient Hospital Stay (HOSPITAL_COMMUNITY)
Admission: EM | Admit: 2017-10-09 | Discharge: 2017-10-10 | DRG: 247 | Disposition: A | Discharge status: Home / Self Care | Payer: 59 | Attending: Interventional Cardiology | Admitting: Interventional Cardiology

## 2017-10-09 ENCOUNTER — Encounter (HOSPITAL_COMMUNITY)
Admission: EM | Disposition: A | Discharge status: Home / Self Care | Payer: Self-pay | Source: Home / Self Care | Attending: Interventional Cardiology

## 2017-10-09 ENCOUNTER — Observation Stay (HOSPITAL_BASED_OUTPATIENT_CLINIC_OR_DEPARTMENT_OTHER): Payer: 59

## 2017-10-09 ENCOUNTER — Emergency Department (HOSPITAL_COMMUNITY): Payer: 59

## 2017-10-09 DIAGNOSIS — E785 Hyperlipidemia, unspecified: Secondary | ICD-10-CM | POA: Diagnosis present

## 2017-10-09 DIAGNOSIS — I10 Essential (primary) hypertension: Secondary | ICD-10-CM | POA: Diagnosis present

## 2017-10-09 DIAGNOSIS — I2510 Atherosclerotic heart disease of native coronary artery without angina pectoris: Secondary | ICD-10-CM | POA: Diagnosis present

## 2017-10-09 DIAGNOSIS — Z720 Tobacco use: Secondary | ICD-10-CM

## 2017-10-09 DIAGNOSIS — Z903 Acquired absence of stomach [part of]: Secondary | ICD-10-CM

## 2017-10-09 DIAGNOSIS — R12 Heartburn: Secondary | ICD-10-CM | POA: Diagnosis present

## 2017-10-09 DIAGNOSIS — F17210 Nicotine dependence, cigarettes, uncomplicated: Secondary | ICD-10-CM | POA: Diagnosis present

## 2017-10-09 DIAGNOSIS — K2970 Gastritis, unspecified, without bleeding: Secondary | ICD-10-CM | POA: Diagnosis present

## 2017-10-09 DIAGNOSIS — Z955 Presence of coronary angioplasty implant and graft: Secondary | ICD-10-CM

## 2017-10-09 DIAGNOSIS — R079 Chest pain, unspecified: Secondary | ICD-10-CM

## 2017-10-09 DIAGNOSIS — Z7982 Long term (current) use of aspirin: Secondary | ICD-10-CM

## 2017-10-09 DIAGNOSIS — I252 Old myocardial infarction: Secondary | ICD-10-CM

## 2017-10-09 DIAGNOSIS — I214 Non-ST elevation (NSTEMI) myocardial infarction: Secondary | ICD-10-CM | POA: Diagnosis present

## 2017-10-09 DIAGNOSIS — Z79899 Other long term (current) drug therapy: Secondary | ICD-10-CM | POA: Diagnosis not present

## 2017-10-09 DIAGNOSIS — Z7902 Long term (current) use of antithrombotics/antiplatelets: Secondary | ICD-10-CM

## 2017-10-09 HISTORY — PX: LEFT HEART CATH AND CORONARY ANGIOGRAPHY: CATH118249

## 2017-10-09 HISTORY — PX: CORONARY STENT INTERVENTION: CATH118234

## 2017-10-09 HISTORY — PX: ULTRASOUND GUIDANCE FOR VASCULAR ACCESS: SHX6516

## 2017-10-09 LAB — COMPREHENSIVE METABOLIC PANEL
ALT: 32 U/L (ref 17–63)
ANION GAP: 7 (ref 5–15)
AST: 29 U/L (ref 15–41)
Albumin: 3.6 g/dL (ref 3.5–5.0)
Alkaline Phosphatase: 51 U/L (ref 38–126)
BILIRUBIN TOTAL: 0.5 mg/dL (ref 0.3–1.2)
BUN: 16 mg/dL (ref 6–20)
CHLORIDE: 111 mmol/L (ref 101–111)
CO2: 23 mmol/L (ref 22–32)
Calcium: 8.3 mg/dL — ABNORMAL LOW (ref 8.9–10.3)
Creatinine, Ser: 1.07 mg/dL (ref 0.61–1.24)
Glucose, Bld: 102 mg/dL — ABNORMAL HIGH (ref 65–99)
POTASSIUM: 4.1 mmol/L (ref 3.5–5.1)
Sodium: 141 mmol/L (ref 135–145)
TOTAL PROTEIN: 5.4 g/dL — AB (ref 6.5–8.1)

## 2017-10-09 LAB — CBC
HCT: 36.2 % — ABNORMAL LOW (ref 39.0–52.0)
HEMATOCRIT: 34.7 % — AB (ref 39.0–52.0)
HEMATOCRIT: 39.2 % (ref 39.0–52.0)
HEMOGLOBIN: 12 g/dL — AB (ref 13.0–17.0)
HEMOGLOBIN: 13.2 g/dL (ref 13.0–17.0)
Hemoglobin: 11.4 g/dL — ABNORMAL LOW (ref 13.0–17.0)
MCH: 30.5 pg (ref 26.0–34.0)
MCH: 30.6 pg (ref 26.0–34.0)
MCH: 30.8 pg (ref 26.0–34.0)
MCHC: 32.9 g/dL (ref 30.0–36.0)
MCHC: 33.1 g/dL (ref 30.0–36.0)
MCHC: 33.7 g/dL (ref 30.0–36.0)
MCV: 91.4 fL (ref 78.0–100.0)
MCV: 92.1 fL (ref 78.0–100.0)
MCV: 93 fL (ref 78.0–100.0)
PLATELETS: 182 10*3/uL (ref 150–400)
PLATELETS: 140 10*3/uL — AB (ref 150–400)
Platelets: 198 10*3/uL (ref 150–400)
RBC: 3.73 MIL/uL — ABNORMAL LOW (ref 4.22–5.81)
RBC: 3.93 MIL/uL — ABNORMAL LOW (ref 4.22–5.81)
RBC: 4.29 MIL/uL (ref 4.22–5.81)
RDW: 13.8 % (ref 11.5–15.5)
RDW: 13.8 % (ref 11.5–15.5)
RDW: 13.9 % (ref 11.5–15.5)
WBC: 6.8 10*3/uL (ref 4.0–10.5)
WBC: 7.3 10*3/uL (ref 4.0–10.5)
WBC: 7.4 10*3/uL (ref 4.0–10.5)

## 2017-10-09 LAB — POCT ACTIVATED CLOTTING TIME
ACTIVATED CLOTTING TIME: 279 s
Activated Clotting Time: 257 seconds

## 2017-10-09 LAB — BASIC METABOLIC PANEL
ANION GAP: 8 (ref 5–15)
BUN: 18 mg/dL (ref 6–20)
CO2: 23 mmol/L (ref 22–32)
Calcium: 8.7 mg/dL — ABNORMAL LOW (ref 8.9–10.3)
Chloride: 109 mmol/L (ref 101–111)
Creatinine, Ser: 1.32 mg/dL — ABNORMAL HIGH (ref 0.61–1.24)
GFR, EST NON AFRICAN AMERICAN: 59 mL/min — AB (ref >60)
Glucose, Bld: 98 mg/dL (ref 65–99)
POTASSIUM: 4.3 mmol/L (ref 3.5–5.1)
SODIUM: 140 mmol/L (ref 135–145)

## 2017-10-09 LAB — TROPONIN I
TROPONIN I: 0.55 ng/mL — AB (ref <0.03)
TROPONIN I: 0.45 ng/mL — AB (ref <0.03)
TROPONIN I: 0.48 ng/mL — AB (ref <0.03)
Troponin I: 0.56 ng/mL (ref <0.03)

## 2017-10-09 LAB — PROTIME-INR
INR: 1.08
PROTHROMBIN TIME: 13.9 s (ref 11.4–15.2)

## 2017-10-09 LAB — GLUCOSE, CAPILLARY
GLUCOSE-CAPILLARY: 76 mg/dL (ref 65–99)
Glucose-Capillary: 90 mg/dL (ref 65–99)

## 2017-10-09 LAB — ECHOCARDIOGRAM COMPLETE
HEIGHTINCHES: 70 in
Weight: 3121.6 oz

## 2017-10-09 LAB — HEPARIN LEVEL (UNFRACTIONATED): HEPARIN UNFRACTIONATED: 0.23 [IU]/mL — AB (ref 0.30–0.70)

## 2017-10-09 LAB — BRAIN NATRIURETIC PEPTIDE: B NATRIURETIC PEPTIDE 5: 29.8 pg/mL (ref 0.0–100.0)

## 2017-10-09 LAB — CREATININE, SERUM
Creatinine, Ser: 1.07 mg/dL (ref 0.61–1.24)
GFR calc Af Amer: >60 mL/min (ref >60)
GFR calc non Af Amer: >60 mL/min (ref >60)

## 2017-10-09 LAB — TSH: TSH: 1.516 u[IU]/mL (ref 0.350–4.500)

## 2017-10-09 LAB — MRSA PCR SCREENING: MRSA BY PCR: NEGATIVE

## 2017-10-09 SURGERY — LEFT HEART CATH AND CORONARY ANGIOGRAPHY
Anesthesia: LOCAL

## 2017-10-09 MED ORDER — LABETALOL HCL 5 MG/ML IV SOLN: 10.0000 mg | INTRAVENOUS | Status: AC | PRN

## 2017-10-09 MED ORDER — PANTOPRAZOLE SODIUM 40 MG PO TBEC
40.0000 mg | DELAYED_RELEASE_TABLET | Freq: Every day | ORAL | Status: DC
  Administered 2017-10-09 – 2017-10-10 (×2): 40 mg via ORAL
  Filled 2017-10-09 (×2): qty 1

## 2017-10-09 MED ORDER — IOPAMIDOL (ISOVUE-370) INJECTION 76%
INTRAVENOUS | Status: AC
  Filled 2017-10-09: qty 100

## 2017-10-09 MED ORDER — OXYCODONE HCL 5 MG PO TABS: 5.0000 mg | ORAL_TABLET | ORAL | Status: DC | PRN

## 2017-10-09 MED ORDER — ATORVASTATIN CALCIUM 80 MG PO TABS
80.0000 mg | ORAL_TABLET | Freq: Every day | ORAL | Status: DC
  Administered 2017-10-09: 19:00:00 80 mg via ORAL
  Filled 2017-10-09: qty 1

## 2017-10-09 MED ORDER — HEPARIN (PORCINE) IN NACL 100-0.45 UNIT/ML-% IJ SOLN: 1200.0000 [IU]/h | INTRAMUSCULAR | Status: DC

## 2017-10-09 MED ORDER — IOPAMIDOL (ISOVUE-370) INJECTION 76%
INTRAVENOUS | Status: DC | PRN
  Administered 2017-10-09: 145 mL via INTRA_ARTERIAL

## 2017-10-09 MED ORDER — SODIUM CHLORIDE 0.9% FLUSH: 3.0000 mL | INTRAVENOUS | Status: DC | PRN

## 2017-10-09 MED ORDER — ACETAMINOPHEN 325 MG PO TABS
650.0000 mg | ORAL_TABLET | ORAL | Status: DC | PRN
  Administered 2017-10-09: 650 mg via ORAL
  Filled 2017-10-09: qty 2

## 2017-10-09 MED ORDER — SODIUM CHLORIDE 0.9 % IV BOLUS (SEPSIS)
1000.0000 mL | Freq: Once | INTRAVENOUS | Status: AC
  Administered 2017-10-09: 1000 mL via INTRAVENOUS

## 2017-10-09 MED ORDER — HEPARIN SODIUM (PORCINE) 5000 UNIT/ML IJ SOLN
5000.0000 [IU] | Freq: Three times a day (TID) | INTRAMUSCULAR | Status: DC
  Administered 2017-10-10: 07:00:00 5000 [IU] via SUBCUTANEOUS
  Filled 2017-10-09: qty 1

## 2017-10-09 MED ORDER — ONDANSETRON HCL 4 MG/2ML IJ SOLN: 4.0000 mg | Freq: Four times a day (QID) | INTRAMUSCULAR | Status: DC | PRN

## 2017-10-09 MED ORDER — LIDOCAINE HCL (PF) 1 % IJ SOLN
INTRAMUSCULAR | Status: AC
  Filled 2017-10-09: qty 30

## 2017-10-09 MED ORDER — NITROGLYCERIN 1 MG/10 ML FOR IR/CATH LAB
INTRA_ARTERIAL | Status: AC
  Filled 2017-10-09: qty 10

## 2017-10-09 MED ORDER — FENTANYL CITRATE (PF) 100 MCG/2ML IJ SOLN
INTRAMUSCULAR | Status: DC | PRN
  Administered 2017-10-09 (×2): 50 ug via INTRAVENOUS

## 2017-10-09 MED ORDER — FENTANYL CITRATE (PF) 100 MCG/2ML IJ SOLN
INTRAMUSCULAR | Status: AC
  Filled 2017-10-09: qty 2

## 2017-10-09 MED ORDER — ATORVASTATIN CALCIUM 80 MG PO TABS: 80.0000 mg | ORAL_TABLET | Freq: Every evening | ORAL | Status: DC

## 2017-10-09 MED ORDER — HEPARIN (PORCINE) IN NACL 2-0.9 UNIT/ML-% IJ SOLN
INTRAMUSCULAR | Status: AC | PRN
  Administered 2017-10-09: 1000 mL via INTRA_ARTERIAL

## 2017-10-09 MED ORDER — MIDAZOLAM HCL 2 MG/2ML IJ SOLN
INTRAMUSCULAR | Status: AC
  Filled 2017-10-09: qty 2

## 2017-10-09 MED ORDER — HEPARIN (PORCINE) IN NACL 100-0.45 UNIT/ML-% IJ SOLN
1050.0000 [IU]/h | INTRAMUSCULAR | Status: DC
  Administered 2017-10-09: 1050 [IU]/h via INTRAVENOUS
  Filled 2017-10-09 (×2): qty 250

## 2017-10-09 MED ORDER — ACETAMINOPHEN 325 MG PO TABS
650.0000 mg | ORAL_TABLET | Freq: Once | ORAL | Status: AC
  Administered 2017-10-09: 650 mg via ORAL
  Filled 2017-10-09: qty 2

## 2017-10-09 MED ORDER — HYDROCHLOROTHIAZIDE 12.5 MG PO CAPS
12.5000 mg | ORAL_CAPSULE | Freq: Every evening | ORAL | Status: DC
  Administered 2017-10-09: 12.5 mg via ORAL
  Filled 2017-10-09: qty 1

## 2017-10-09 MED ORDER — ASPIRIN 81 MG PO CHEW
324.0000 mg | CHEWABLE_TABLET | Freq: Once | ORAL | Status: AC
  Administered 2017-10-09: 324 mg via ORAL
  Filled 2017-10-09: qty 4

## 2017-10-09 MED ORDER — MIDAZOLAM HCL 2 MG/2ML IJ SOLN
INTRAMUSCULAR | Status: DC | PRN
  Administered 2017-10-09 (×2): 1 mg via INTRAVENOUS

## 2017-10-09 MED ORDER — NITROGLYCERIN IN D5W 200-5 MCG/ML-% IV SOLN: 0.0000 ug/min | INTRAVENOUS | Status: DC

## 2017-10-09 MED ORDER — ONDANSETRON HCL 4 MG/2ML IJ SOLN
4.0000 mg | Freq: Once | INTRAMUSCULAR | Status: AC
  Administered 2017-10-09: 4 mg via INTRAVENOUS
  Filled 2017-10-09: qty 2

## 2017-10-09 MED ORDER — VERAPAMIL HCL 2.5 MG/ML IV SOLN
INTRAVENOUS | Status: DC | PRN
  Administered 2017-10-09: 10 mL via INTRA_ARTERIAL

## 2017-10-09 MED ORDER — SODIUM CHLORIDE 0.9% FLUSH
3.0000 mL | Freq: Two times a day (BID) | INTRAVENOUS | Status: DC
  Administered 2017-10-09: 19:00:00 3 mL via INTRAVENOUS

## 2017-10-09 MED ORDER — HEPARIN SODIUM (PORCINE) 1000 UNIT/ML IJ SOLN
INTRAMUSCULAR | Status: AC
  Filled 2017-10-09: qty 1

## 2017-10-09 MED ORDER — SODIUM CHLORIDE 0.9 % IV SOLN: INTRAVENOUS | Status: AC

## 2017-10-09 MED ORDER — HEPARIN (PORCINE) IN NACL 2-0.9 UNIT/ML-% IJ SOLN
INTRAMUSCULAR | Status: AC
  Filled 2017-10-09: qty 1000

## 2017-10-09 MED ORDER — HYDRALAZINE HCL 20 MG/ML IJ SOLN: 5.0000 mg | INTRAMUSCULAR | Status: AC | PRN

## 2017-10-09 MED ORDER — CLOPIDOGREL BISULFATE 75 MG PO TABS
75.0000 mg | ORAL_TABLET | Freq: Every day | ORAL | Status: DC
  Administered 2017-10-09: 75 mg via ORAL
  Filled 2017-10-09: qty 1

## 2017-10-09 MED ORDER — VERAPAMIL HCL 2.5 MG/ML IV SOLN
INTRAVENOUS | Status: AC
  Filled 2017-10-09: qty 2

## 2017-10-09 MED ORDER — METOPROLOL TARTRATE 12.5 MG HALF TABLET
12.5000 mg | ORAL_TABLET | Freq: Two times a day (BID) | ORAL | Status: DC
  Administered 2017-10-09 – 2017-10-10 (×3): 12.5 mg via ORAL
  Filled 2017-10-09 (×3): qty 1

## 2017-10-09 MED ORDER — ASPIRIN 81 MG PO CHEW
81.0000 mg | CHEWABLE_TABLET | Freq: Every day | ORAL | Status: DC
  Administered 2017-10-09: 81 mg via ORAL
  Filled 2017-10-09: qty 1

## 2017-10-09 MED ORDER — HEPARIN SODIUM (PORCINE) 1000 UNIT/ML IJ SOLN
INTRAMUSCULAR | Status: DC | PRN
  Administered 2017-10-09: 4500 [IU] via INTRAVENOUS
  Administered 2017-10-09: 5500 [IU] via INTRAVENOUS
  Administered 2017-10-09: 2500 [IU] via INTRAVENOUS
  Administered 2017-10-09: 2000 [IU] via INTRAVENOUS

## 2017-10-09 MED ORDER — ASPIRIN 81 MG PO CHEW
81.0000 mg | CHEWABLE_TABLET | Freq: Every day | ORAL | Status: DC
  Administered 2017-10-10: 81 mg via ORAL
  Filled 2017-10-09: qty 1

## 2017-10-09 MED ORDER — SODIUM CHLORIDE 0.9 % IV SOLN: 250.0000 mL | INTRAVENOUS | Status: DC | PRN

## 2017-10-09 MED ORDER — NITROGLYCERIN IN D5W 200-5 MCG/ML-% IV SOLN
0.0000 ug/min | Freq: Once | INTRAVENOUS | Status: AC
  Administered 2017-10-09: 5 ug/min via INTRAVENOUS
  Filled 2017-10-09: qty 250

## 2017-10-09 MED ORDER — LOSARTAN POTASSIUM 50 MG PO TABS
100.0000 mg | ORAL_TABLET | Freq: Every evening | ORAL | Status: DC
  Administered 2017-10-09: 19:00:00 100 mg via ORAL
  Filled 2017-10-09: qty 2

## 2017-10-09 MED ORDER — CLOPIDOGREL BISULFATE 75 MG PO TABS
ORAL_TABLET | ORAL | Status: AC
  Filled 2017-10-09: qty 1

## 2017-10-09 MED ORDER — HEPARIN BOLUS VIA INFUSION
4000.0000 [IU] | Freq: Once | INTRAVENOUS | Status: AC
  Administered 2017-10-09: 4000 [IU] via INTRAVENOUS
  Filled 2017-10-09: qty 4000

## 2017-10-09 MED ORDER — CLOPIDOGREL BISULFATE 75 MG PO TABS
75.0000 mg | ORAL_TABLET | Freq: Every day | ORAL | Status: DC
  Administered 2017-10-10: 08:00:00 75 mg via ORAL
  Filled 2017-10-09: qty 1

## 2017-10-09 MED ORDER — LIDOCAINE HCL (PF) 1 % IJ SOLN
INTRAMUSCULAR | Status: DC | PRN
  Administered 2017-10-09: 2 mL via SUBCUTANEOUS

## 2017-10-09 MED ORDER — CLOPIDOGREL BISULFATE 75 MG PO TABS
ORAL_TABLET | ORAL | Status: DC | PRN
  Administered 2017-10-09: 150 mg via ORAL

## 2017-10-09 MED ORDER — ISOSORBIDE MONONITRATE ER 30 MG PO TB24: 30.0000 mg | ORAL_TABLET | Freq: Every day | ORAL | Status: DC

## 2017-10-09 MED ORDER — PANTOPRAZOLE SODIUM 40 MG IV SOLR
40.0000 mg | Freq: Two times a day (BID) | INTRAVENOUS | Status: DC
  Administered 2017-10-09: 40 mg via INTRAVENOUS
  Filled 2017-10-09 (×2): qty 40

## 2017-10-09 MED ORDER — MORPHINE SULFATE (PF) 4 MG/ML IV SOLN
6.0000 mg | Freq: Once | INTRAVENOUS | Status: AC
Start: 2017-10-09 — End: 2017-10-09
  Administered 2017-10-09: 6 mg via INTRAVENOUS
  Filled 2017-10-09: qty 2

## 2017-10-09 MED ORDER — NITROGLYCERIN 0.4 MG SL SUBL: 0.4000 mg | SUBLINGUAL_TABLET | SUBLINGUAL | Status: DC | PRN

## 2017-10-09 MED ORDER — NITROGLYCERIN 1 MG/10 ML FOR IR/CATH LAB
INTRA_ARTERIAL | Status: DC | PRN
  Administered 2017-10-09: 200 ug via INTRACORONARY

## 2017-10-09 MED ORDER — ANGIOPLASTY BOOK
Freq: Once | Status: AC
  Administered 2017-10-09: 22:00:00 1
  Filled 2017-10-09: qty 1

## 2017-10-09 SURGICAL SUPPLY — 18 items
BALLN ~~LOC~~ EUPHORA RX 3.75X8 (BALLOONS) ×3
BALLOON ~~LOC~~ EUPHORA RX 3.75X8 (BALLOONS) ×2 IMPLANT
CATH INFINITI 5 FR JL3.5 (CATHETERS) ×3 IMPLANT
CATH INFINITI JR4 5F (CATHETERS) ×3 IMPLANT
CATH VISTA GUIDE 6FR XB3.5 (CATHETERS) ×3 IMPLANT
COVER PRB 48X5XTLSCP FOLD TPE (BAG) ×2 IMPLANT
COVER PROBE 5X48 (BAG) ×1
DEVICE RAD COMP TR BAND LRG (VASCULAR PRODUCTS) ×3 IMPLANT
GLIDESHEATH SLEND A-KIT 6F 22G (SHEATH) ×3 IMPLANT
GUIDEWIRE INQWIRE 1.5J.035X260 (WIRE) ×2 IMPLANT
INQWIRE 1.5J .035X260CM (WIRE) ×3
KIT ENCORE 26 ADVANTAGE (KITS) ×3 IMPLANT
KIT HEART LEFT (KITS) ×3 IMPLANT
PACK CARDIAC CATHETERIZATION (CUSTOM PROCEDURE TRAY) ×3 IMPLANT
STENT SYNERGY DES 3.5X12 (Permanent Stent) ×3 IMPLANT
TRANSDUCER W/STOPCOCK (MISCELLANEOUS) ×3 IMPLANT
TUBING CIL FLEX 10 FLL-RA (TUBING) ×3 IMPLANT
WIRE ASAHI PROWATER 180CM (WIRE) ×3 IMPLANT

## 2017-10-09 NOTE — ED Triage Notes (Signed)
The pt has had central chest pain for 2 hours with sob headache nausea he has taken 4 sl nitro with some relief  apprehensive

## 2017-10-09 NOTE — Progress Notes (Signed)
  Echocardiogram 2D Echocardiogram has been performed.  Lucas Norton 10/09/2017, 11:11 AM

## 2017-10-09 NOTE — Progress Notes (Signed)
   10/09/17 0414  Vitals  Temp 98 F (36.7 C)  Temp Source Oral  BP (!) 178/88  MAP (mmHg) 107  BP Location Right Arm  BP Method Automatic  Patient Position (if appropriate) Lying  Pulse Rate 63  Pulse Rate Source Monitor  Oxygen Therapy  SpO2 100 %  O2 Device Room Air  Height and Weight  Height 5\' 10"  (1.778 m)  Weight 88.5 kg (195 lb 1.6 oz)  BSA (Calculated - sq m) 2.09 sq meters  BMI (Calculated) 27.99  Weight in (lb) to have BMI = 25 173.9   Pt admitted from the ED. Pt A&Ox4, cooperative, and compliant w/ tx. He complains of 4/10 substernal CP and headache. MD made aware. Titrated nitro gtt to 15 mcg and given tylenol for headache. Will continue to reassess for effectiveness. Pt and wife oriented to the unit, room, and fall safety precautions. CCMD notified and verified. Will monitor.

## 2017-10-09 NOTE — H&P (View-Only) (Signed)
Progress Note  Patient Name: Lucas Norton Date of Encounter: 10/09/2017  Primary Cardiologist: Dr Johnsie Cancel  Subjective   Mild chest ache but no symptoms similar to presentation; mild dyspnea  Inpatient Medications    Scheduled Meds: . aspirin  81 mg Oral Daily  . atorvastatin  80 mg Oral QPM  . clopidogrel  75 mg Oral Daily  . hydrochlorothiazide  12.5 mg Oral QPM  . isosorbide mononitrate  30 mg Oral Daily  . losartan  100 mg Oral QPM  . metoprolol tartrate  12.5 mg Oral BID  . pantoprazole (PROTONIX) IV  40 mg Intravenous Q12H   Continuous Infusions: . heparin 1,050 Units/hr (10/09/17 0323)  . nitroGLYCERIN 15 mcg/min (10/09/17 0441)   PRN Meds: nitroGLYCERIN   Vital Signs    Vitals:   10/09/17 0309 10/09/17 0345 10/09/17 0414 10/09/17 0600  BP: (!) 151/91 (!) 129/96 (!) 178/88 140/90  Pulse: (!) 52 62 63   Resp: 14 18    Temp: 97.8 F (36.6 C)  98 F (36.7 C)   TempSrc: Oral  Oral   SpO2: 99% 98% 100% 95%  Weight:   195 lb 1.6 oz (88.5 kg)   Height:   5\' 10"  (1.778 m)     Intake/Output Summary (Last 24 hours) at 10/09/2017 0759 Last data filed at 10/09/2017 0500 Gross per 24 hour  Intake 1025.54 ml  Output -  Net 1025.54 ml   Filed Weights   10/09/17 0009 10/09/17 0414  Weight: 190 lb (86.2 kg) 195 lb 1.6 oz (88.5 kg)    Telemetry    Sinus- Personally Reviewed  Physical Exam   GEN: No acute distress.   Neck: No JVD Cardiac: RRR, no murmurs, rubs, or gallops.  Respiratory: Clear to auscultation bilaterally. GI: Soft, nontender, non-distended  MS: No edema; No deformity. Neuro:  Nonfocal  Psych: Normal affect   Labs    Chemistry Recent Labs  Lab 10/09/17 0022 10/09/17 0427  NA 140 141  K 4.3 4.1  CL 109 111  CO2 23 23  GLUCOSE 98 102*  BUN 18 16  CREATININE 1.32* 1.07  CALCIUM 8.7* 8.3*  PROT  --  5.4*  ALBUMIN  --  3.6  AST  --  29  ALT  --  32  ALKPHOS  --  51  BILITOT  --  0.5  GFRNONAA 59* >60  GFRAA >60 >60    ANIONGAP 8 7     Hematology Recent Labs  Lab 10/09/17 0022 10/09/17 0427  WBC 7.3 6.8  RBC 4.29 3.93*  HGB 13.2 12.0*  HCT 39.2 36.2*  MCV 91.4 92.1  MCH 30.8 30.5  MCHC 33.7 33.1  RDW 13.8 13.8  PLT 198 182    Cardiac Enzymes Recent Labs  Lab 10/09/17 0022 10/09/17 0427  TROPONINI 0.56* 0.55*    BNP Recent Labs  Lab 10/09/17 0427  BNP 29.8      Radiology    Dg Chest 2 View  Result Date: 10/09/2017 CLINICAL DATA:  Chest pain and shortness of breath EXAM: CHEST  2 VIEW COMPARISON:  May 31, 2017 FINDINGS: There are scattered calcified granulomas. There is no edema or consolidation. Heart size and pulmonary vascularity are normal. No adenopathy. There is aortic atherosclerosis. There are surgical clips at the gastroesophageal junction. IMPRESSION: Scattered calcified granulomas. No edema or consolidation. Stable cardiac silhouette. There is aortic atherosclerosis. Aortic Atherosclerosis (ICD10-I70.0). Electronically Signed   By: Lowella Grip III M.D.   On: 10/09/2017 00:53  Patient Profile     56 y.o. male with h/o PCI, D2, Lcx with NSTEMI  Assessment & Plan    1 NSTEMI-troponin abnormal; continue ASA, plavix (pt with dyspnea on brilinta previously), heparin, NTG, metoprolol and statin. DC imdur while on IV NTG. Proceed with cath (risks and benefits including MI, death and CVA discussed and pt agrees to proceed).  2 tobacco abuse-pt counseled on discontinuing  3 Hypertension-BP elevated; resume preadmission meds and adjust as needed  4 Hyperlipidemia-continue statin.  Additional 30 min spent with pt this am 7:30 to 8:00 AM.  For questions or updates, please contact [place] Please consult [email or web address] for contact info under Cardiology/STEMI.      Signed, Kirk Ruths, MD  10/09/2017, 7:59 AM

## 2017-10-09 NOTE — ED Triage Notes (Signed)
2 stents placed in his coronary arteries 6 months ago

## 2017-10-09 NOTE — Interval H&P Note (Signed)
Cath Lab Visit (complete for each Cath Lab visit)  Clinical Evaluation Leading to the Procedure:   ACS: Yes.    Non-ACS:    Anginal Classification: CCS III  Anti-ischemic medical therapy: Minimal Therapy (1 class of medications)  Non-Invasive Test Results: No non-invasive testing performed  Prior CABG: No previous CABG      History and Physical Interval Note:  10/09/2017 12:51 PM  Lucas Norton  has presented today for surgery, with the diagnosis of cp  The various methods of treatment have been discussed with the patient and family. After consideration of risks, benefits and other options for treatment, the patient has consented to  Procedure(s): LEFT HEART CATH AND CORONARY ANGIOGRAPHY (N/A) Ultrasound Guidance For Vascular Access as a surgical intervention .  The patient's history has been reviewed, patient examined, no change in status, stable for surgery.  I have reviewed the patient's chart and labs.  Questions were answered to the patient's satisfaction.     Belva Crome III

## 2017-10-09 NOTE — Progress Notes (Signed)
Progress Note  Patient Name: Lucas Norton Date of Encounter: 10/09/2017  Primary Cardiologist: Dr Johnsie Cancel  Subjective   Mild chest ache but no symptoms similar to presentation; mild dyspnea  Inpatient Medications    Scheduled Meds: . aspirin  81 mg Oral Daily  . atorvastatin  80 mg Oral QPM  . clopidogrel  75 mg Oral Daily  . hydrochlorothiazide  12.5 mg Oral QPM  . isosorbide mononitrate  30 mg Oral Daily  . losartan  100 mg Oral QPM  . metoprolol tartrate  12.5 mg Oral BID  . pantoprazole (PROTONIX) IV  40 mg Intravenous Q12H   Continuous Infusions: . heparin 1,050 Units/hr (10/09/17 0323)  . nitroGLYCERIN 15 mcg/min (10/09/17 0441)   PRN Meds: nitroGLYCERIN   Vital Signs    Vitals:   10/09/17 0309 10/09/17 0345 10/09/17 0414 10/09/17 0600  BP: (!) 151/91 (!) 129/96 (!) 178/88 140/90  Pulse: (!) 52 62 63   Resp: 14 18    Temp: 97.8 F (36.6 C)  98 F (36.7 C)   TempSrc: Oral  Oral   SpO2: 99% 98% 100% 95%  Weight:   195 lb 1.6 oz (88.5 kg)   Height:   5\' 10"  (1.778 m)     Intake/Output Summary (Last 24 hours) at 10/09/2017 0759 Last data filed at 10/09/2017 0500 Gross per 24 hour  Intake 1025.54 ml  Output -  Net 1025.54 ml   Filed Weights   10/09/17 0009 10/09/17 0414  Weight: 190 lb (86.2 kg) 195 lb 1.6 oz (88.5 kg)    Telemetry    Sinus- Personally Reviewed  Physical Exam   GEN: No acute distress.   Neck: No JVD Cardiac: RRR, no murmurs, rubs, or gallops.  Respiratory: Clear to auscultation bilaterally. GI: Soft, nontender, non-distended  MS: No edema; No deformity. Neuro:  Nonfocal  Psych: Normal affect   Labs    Chemistry Recent Labs  Lab 10/09/17 0022 10/09/17 0427  NA 140 141  K 4.3 4.1  CL 109 111  CO2 23 23  GLUCOSE 98 102*  BUN 18 16  CREATININE 1.32* 1.07  CALCIUM 8.7* 8.3*  PROT  --  5.4*  ALBUMIN  --  3.6  AST  --  29  ALT  --  32  ALKPHOS  --  51  BILITOT  --  0.5  GFRNONAA 59* >60  GFRAA >60 >60    ANIONGAP 8 7     Hematology Recent Labs  Lab 10/09/17 0022 10/09/17 0427  WBC 7.3 6.8  RBC 4.29 3.93*  HGB 13.2 12.0*  HCT 39.2 36.2*  MCV 91.4 92.1  MCH 30.8 30.5  MCHC 33.7 33.1  RDW 13.8 13.8  PLT 198 182    Cardiac Enzymes Recent Labs  Lab 10/09/17 0022 10/09/17 0427  TROPONINI 0.56* 0.55*    BNP Recent Labs  Lab 10/09/17 0427  BNP 29.8      Radiology    Dg Chest 2 View  Result Date: 10/09/2017 CLINICAL DATA:  Chest pain and shortness of breath EXAM: CHEST  2 VIEW COMPARISON:  May 31, 2017 FINDINGS: There are scattered calcified granulomas. There is no edema or consolidation. Heart size and pulmonary vascularity are normal. No adenopathy. There is aortic atherosclerosis. There are surgical clips at the gastroesophageal junction. IMPRESSION: Scattered calcified granulomas. No edema or consolidation. Stable cardiac silhouette. There is aortic atherosclerosis. Aortic Atherosclerosis (ICD10-I70.0). Electronically Signed   By: Lowella Grip III M.D.   On: 10/09/2017 00:53  Patient Profile     56 y.o. male with h/o PCI, D2, Lcx with NSTEMI  Assessment & Plan    1 NSTEMI-troponin abnormal; continue ASA, plavix (pt with dyspnea on brilinta previously), heparin, NTG, metoprolol and statin. DC imdur while on IV NTG. Proceed with cath (risks and benefits including MI, death and CVA discussed and pt agrees to proceed).  2 tobacco abuse-pt counseled on discontinuing  3 Hypertension-BP elevated; resume preadmission meds and adjust as needed  4 Hyperlipidemia-continue statin.  Additional 30 min spent with pt this am 7:30 to 8:00 AM.  For questions or updates, please contact [place] Please consult [email or web address] for contact info under Cardiology/STEMI.      Signed, Kirk Ruths, MD  10/09/2017, 7:59 AM

## 2017-10-09 NOTE — Progress Notes (Signed)
ANTICOAGULATION CONSULT NOTE - Follow Up Consult  Pharmacy Consult for Heparin Indication: chest pain/ACS  Allergies  Allergen Reactions  . Brilinta [Ticagrelor] Shortness Of Breath    Patient Measurements: Height: 5\' 10"  (177.8 cm) Weight: 195 lb 1.6 oz (88.5 kg) IBW/kg (Calculated) : 73  Vital Signs: Temp: 98 F (36.7 C) (12/17 0826) Temp Source: Oral (12/17 0826) BP: 133/80 (12/17 0826) Pulse Rate: 60 (12/17 0826)  Labs: Recent Labs    10/09/17 0022 10/09/17 0427 10/09/17 0744  HGB 13.2 12.0*  --   HCT 39.2 36.2*  --   PLT 198 182  --   LABPROT  --  13.9  --   INR  --  1.08  --   HEPARINUNFRC  --   --  0.23*  CREATININE 1.32* 1.07  --   TROPONINI 0.56* 0.55*  --     Estimated Creatinine Clearance: 86.4 mL/min (by C-G formula based on SCr of 1.07 mg/dL).   Medications:  Heparin @ 1050 units/hr  Assessment: 56yom continues on heparin for NSTEMI with plan for cath today. Heparin level is subtherapeutic at 0.23. CBC stable. No bleeding reported.  Goal of Therapy:  Heparin level 0.3-0.7 units/ml Monitor platelets by anticoagulation protocol: Yes   Plan:  1) Increase heparin to 1200 units/hr 2) Follow up after cath  Deboraha Sprang 10/09/2017,10:40 AM

## 2017-10-09 NOTE — Progress Notes (Signed)
ANTICOAGULATION CONSULT NOTE - Initial Consult  Pharmacy Consult for heparin  Indication: chest pain/ACS  Allergies  Allergen Reactions  . Brilinta [Ticagrelor] Shortness Of Breath   Patient Measurements: Height: 5\' 10"  (177.8 cm) Weight: 190 lb (86.2 kg) IBW/kg (Calculated) : 73 Heparin Dosing Weight: 86.2 kg   Vital Signs: Temp: 97.8 F (36.6 C) (12/17 0009) Temp Source: Oral (12/17 0009) BP: 168/120 (12/17 0009) Pulse Rate: 73 (12/17 0009)  Labs: Recent Labs    10/09/17 0022  HGB 13.2  HCT 39.2  PLT 198  CREATININE 1.32*  TROPONINI 0.56*    Estimated Creatinine Clearance: 64.5 mL/min (A) (by C-G formula based on SCr of 1.32 mg/dL (H)).   Medical History: Past Medical History:  Diagnosis Date  . Back pain   . CAD in native artery    a. NSTEMI 04/2017 s/p DES to mLAD and Cx, PTCA to D2, EF 50-55%.  Marland Kitchen HLD (hyperlipidemia)   . Hypertension   . NSTEMI (non-ST elevated myocardial infarction) ([place]) 05/21/2017   05/21/17 PCI/DES to mLAD, mLcx. EF normal  . Sinus bradycardia    unable to titrate BB further due to this  . Tobacco abuse    Assessment: 56 yo male admitted with chest pain. Pharmacy consulted to dose heparin. No oral anticoagulation PTA per medication history.   CBC within normal limits and no s/s bleeding reported.   Goal of Therapy:  Heparin level 0.3-0.7 units/ml Monitor platelets by anticoagulation protocol: Yes   Plan:  Heparin bolus 4000 units IV x1 Start heparin gtt at 1050 units/hr Heparin level in 6 hrs Daily heparin level and CBC Monitor for s/s bleeding  Lavonda Jumbo, PharmD Clinical Pharmacist 10/09/17 1:44 AM

## 2017-10-09 NOTE — H&P (Signed)
CARDIOLOGY Observation HISTORY AND PHYSICAL EXAMINATION NOTE  Patient ID: Lucas Norton MRN: [id number], DOB/AGE: 56   Admit date: 10/09/2017  Primary Physician: Patient, No Pcp Per Primary Cardiologist: Jenkins Rouge MD  Reason for admission: Chest pain  HPI: This is a 56 y.o. Caucasian male with known medical history of NSTEMI 04/2017 s/p DES to mLAD and Cx, PTCA to D2, EF 50-55%, sinus bradycardia, hyperlipidemia, hypertension, partial gastrectomy, ongoing tobacco abuse presented with episode of chest pain. Patient describes the chest pain as substernal pressure, 4-5/10 in intensity, ongoing frequently for the last 1 month, radiates down to the left shoulder with some tingling in the left arm.  This occurred when patient was having intercourse.  It is associated with mild headache, shortness of breath.  Patient denied having any syncope, dizziness, nausea, vomiting, abdominal pain, diaphoresis, paroxysmal nocturnal dyspnea, prior pulmonary embolism, prior DVT, prolonged bed ridden status, limitation for his usual work. Patient is also describing a second burning throat pain.  Which starts usually after eating food at night.  He said that the chest pain is usually associated with this burning throat pain.  This may occur 1-2 hours after eating while he is laying down.  This usually occurs at night.  He has not noticed similar pain during the day He presented with a similar prodrome and symptoms in July 2018 and underwent left heart cath with PCI to mid LAD and mid circumflex and D2 by Dr. Irish Lack. Of note patient has baseline sinus bradycardia and was not able to tolerate beta-blocker in the past.  He was a started on ticagrelor but was switched to clopidogrel due to dyspnea.  His last LDL was 59.  Patient was started on ARB/diuretic combo because of his elevated blood pressure.  Today he presented with stable hemodynamics but with elevated blood pressure with systolics in 782N.  However  patient is not completely chest pain-free and reports 2/10 chest pain.  Is currently on nitroglycerin drip.  Problem List: Past Medical History:  Diagnosis Date  . Back pain   . CAD in native artery    a. NSTEMI 04/2017 s/p DES to mLAD and Cx, PTCA to D2, EF 50-55%.  Marland Kitchen HLD (hyperlipidemia)   . Hypertension   . NSTEMI (non-ST elevated myocardial infarction) ([place]) 05/21/2017   05/21/17 PCI/DES to mLAD, mLcx. EF normal  . Sinus bradycardia    unable to titrate BB further due to this  . Tobacco abuse     Past Surgical History:  Procedure Laterality Date  . BACK SURGERY    . CORONARY BALLOON ANGIOPLASTY N/A 05/21/2017   Procedure: Coronary Balloon Angioplasty;  Surgeon: Jettie Booze, MD;  Location: [facility];  Service: Cardiovascular;  Laterality: N/A;  Mid Diag  . CORONARY STENT INTERVENTION N/A 05/21/2017   Procedure: Coronary Stent Intervention;  Surgeon: Jettie Booze, MD;  Location: [facility];  Service: Cardiovascular;  Laterality: N/A;  Mid LAD,Mid CFX  . LEFT HEART CATH AND CORONARY ANGIOGRAPHY N/A 05/21/2017   Procedure: Left Heart Cath and Coronary Angiography;  Surgeon: Jettie Booze, MD;  Location: [facility];  Service: Cardiovascular;  Laterality: N/A;  . PARTIAL GASTRECTOMY       Allergies:  Allergies  Allergen Reactions  . Brilinta [Ticagrelor] Shortness Of Breath     Home Medications Current Facility-Administered Medications  Medication Dose Route Frequency Provider Last Rate Last Dose  . heparin ADULT infusion 100 units/mL (25000 units/271mL sodium chloride 0.45%)  1,050 Units/hr  Intravenous Continuous Janett Billow, RPH      . heparin bolus via infusion 4,000 Units  4,000 Units Intravenous Once Janett Billow, [facility]       Current Outpatient Medications  Medication Sig Dispense Refill  . Acetaminophen (TYLENOL PO) Take 4 tablets by mouth daily as needed (pain/headache).    Marland Kitchen aspirin 81 MG chewable tablet Chew 1  tablet (81 mg total) by mouth daily. (Patient taking differently: Chew 81 mg by mouth every evening. ) 30 tablet 0  . atorvastatin (LIPITOR) 80 MG tablet Take 1 tablet (80 mg total) by mouth daily at 6 PM. (Patient taking differently: Take 80 mg by mouth every evening. ) 90 tablet 1  . clopidogrel (PLAVIX) 75 MG tablet Take 1 tablet (75 mg total) by mouth daily. 30 tablet 11  . hydrochlorothiazide (MICROZIDE) 12.5 MG capsule Take 12.5 mg by mouth every evening.    . isosorbide mononitrate (IMDUR) 30 MG 24 hr tablet Take 1 tablet (30 mg total) by mouth daily. 30 tablet 6  . losartan (COZAAR) 100 MG tablet Take 1 tablet (100 mg total) daily by mouth. (Patient taking differently: Take 100 mg by mouth every evening. ) 90 tablet 3  . metoprolol tartrate (LOPRESSOR) 25 MG tablet Take 0.5 tablets (12.5 mg total) by mouth 2 (two) times daily. 60 tablet 2  . nitroGLYCERIN (NITROSTAT) 0.4 MG SL tablet Place 1 tablet (0.4 mg total) under the tongue every 5 (five) minutes as needed. (Patient taking differently: Place 0.4 mg under the tongue every 5 (five) minutes as needed for chest pain. ) 25 tablet 3     Family History  Problem Relation Age of Onset  . Hypertension Father      Social History   Socioeconomic History  . Marital status: Married    Spouse name: Not on file  . Number of children: Not on file  . Years of education: Not on file  . Highest education level: Not on file  Social Needs  . Financial resource strain: Not on file  . Food insecurity - worry: Not on file  . Food insecurity - inability: Not on file  . Transportation needs - medical: Not on file  . Transportation needs - non-medical: Not on file  Occupational History  . Not on file  Tobacco Use  . Smoking status: Current Every Day Smoker    Packs/day: 1.00    Years: 20.00    Pack years: 20.00    Types: Cigarettes  . Smokeless tobacco: Never Used  Substance and Sexual Activity  . Alcohol use: Yes    Comment: socially  .  Drug use: No  . Sexual activity: Not on file  Other Topics Concern  . Not on file  Social History Narrative  . Not on file     Review of Systems: General: negative for chills, fever, night sweats or weight changes.  Cardiovascular: chest pain, dyspnea negative for dyspnea on exertion, edema, orthopnea, palpitations, paroxysmal nocturnal dyspnea  Dermatological: negative for rash Respiratory: negative for cough or wheezing Urologic: negative for hematuria Abdominal: negative for nausea, vomiting, diarrhea, bright red blood per rectum, melena, or hematemesis Neurologic: negative for visual changes, syncope, or dizziness Endocrine: no diabetes, no hypothyroidism Immunological: no lymph adenopathy Psych: non homicidal/suicidal  Physical Exam: Vitals: BP (!) 168/120 (BP Location: Right Arm)   Pulse 73   Temp 97.8 F (36.6 C) (Oral)   Resp 18   Ht 5\' 10"  (1.778 m)   Wt 86.2 kg (  190 lb)   SpO2 99%   BMI 27.26 kg/m  General: not in acute distress Neck: JVP flat, neck supple Heart: regular rate and rhythm, S1, S2, no murmurs  Lungs: CTAB  GI: non tender, non distended, bowel sounds present Extremities: no edema Neuro: AAO x 3  Psych: normal affect, no anxiety   Labs:   Results for orders placed or performed during the hospital encounter of 10/09/17 (from the past 24 hour(s))  Basic metabolic panel     Status: Abnormal   Collection Time: 10/09/17 12:22 AM  Result Value Ref Range   Sodium 140 135 - 145 mmol/L   Potassium 4.3 3.5 - 5.1 mmol/L   Chloride 109 101 - 111 mmol/L   CO2 23 22 - 32 mmol/L   Glucose, Bld 98 65 - 99 mg/dL   BUN 18 6 - 20 mg/dL   Creatinine, Ser 1.32 (H) 0.61 - 1.24 mg/dL   Calcium 8.7 (L) 8.9 - 10.3 mg/dL   GFR calc non Af Amer 59 (L) >60 mL/min   GFR calc Af Amer >60 >60 mL/min   Anion gap 8 5 - 15  CBC     Status: None   Collection Time: 10/09/17 12:22 AM  Result Value Ref Range   WBC 7.3 4.0 - 10.5 K/uL   RBC 4.29 4.22 - 5.81 MIL/uL    Hemoglobin 13.2 13.0 - 17.0 g/dL   HCT 39.2 39.0 - 52.0 %   MCV 91.4 78.0 - 100.0 fL   MCH 30.8 26.0 - 34.0 pg   MCHC 33.7 30.0 - 36.0 g/dL   RDW 13.8 11.5 - 15.5 %   Platelets 198 150 - 400 K/uL  Troponin I     Status: Abnormal   Collection Time: 10/09/17 12:22 AM  Result Value Ref Range   Troponin I 0.56 (HH) <0.03 ng/mL     Radiology/Studies: Dg Chest 2 View  Result Date: 10/09/2017 CLINICAL DATA:  Chest pain and shortness of breath EXAM: CHEST  2 VIEW COMPARISON:  May 31, 2017 FINDINGS: There are scattered calcified granulomas. There is no edema or consolidation. Heart size and pulmonary vascularity are normal. No adenopathy. There is aortic atherosclerosis. There are surgical clips at the gastroesophageal junction. IMPRESSION: Scattered calcified granulomas. No edema or consolidation. Stable cardiac silhouette. There is aortic atherosclerosis. Aortic Atherosclerosis (ICD10-I70.0). Electronically Signed   By: Lowella Grip III M.D.   On: 10/09/2017 00:53    EKG: 10/09/2017 showed normal sinus rhythm with PACs, nonspecific ST T wave changes.  Echo: None performed  Cardiac cath: 05/21/2017 Mid LAD lesion, 90 %stenosed. A STENT SYNERGY DES 3.5X16 drug eluting stent was successfully placed, postdilated to 3.8 mm. Mid Cx-2 lesion, 90 %stenosed. A STENT SYNERGY DES 3.5X20 drug eluting stent was successfully placed, postdilated to 3.8 mm in diameter. Post intervention, there is a 0% residual stenosis. 2nd Diag lesion, 95 %stenosed. This was treated with balloon angioplasty with a 2.5 mm balloon. Post intervention, there is a 20% residual stenosis. The left ventricular ejection fraction is 50-55% by visual estimate. The left ventricular systolic function is normal. LV end diastolic pressure is normal. There is no aortic valve stenosis.   Continue dual antiplatelet therapy for at least one year without interruption.  Patient with prior gastric surgery for bleeding ulcer so SYNERGY  stents were placed in the event there are issues with antiplatelet therapy absorption, or bleeding.  Medical decision making:  Discussed care with the patient Discussed care with the physician on the  phone Reviewed labs and imaging personally Reviewed prior records  ASSESSMENT AND PLAN:  This is a 56 y.o. male with known medical history of NSTEMI 04/2017 s/p DES to mLAD and Cx, PTCA to D2, EF 50-55%, sinus bradycardia, hyperlipidemia, hypertension, ongoing tobacco abuse presented with episode of chest pain and elevated troponin suggestive of non-STEMI.   Active Problems:   Tobacco abuse   NSTEMI (non-ST elevated myocardial infarction) ([facility])   HTN (hypertension)   HLD (hyperlipidemia)   Chest pain   CAD in native artery  NSTEMI Cycle troponin, serial EKGs prn, lipid panel, TSH, HbA1c, echocardiogram in the AM IV heparin, aspirin, continue atorvastatin 80 mg daily, Consider cardiac catheterization in the morning. Hyperlipidemia, last LDL was 65 at goal continue atorvastatin Cigarette tobacco abuse, discuss sedation cessation of smoking.  He is contemplating and trying to quit. Hypertension, essential uncontrolled goal is less than 130.  Continue losartan 100 mg daily, isosorbide mononitrate 30 mg daily. Heartburn likely gastritis, status post partial gastrectomy  - started on IV Protonix.  Will need outpatient follow-up with gastroenterology for evaluation of heartburn. Signed, Flossie Dibble, MD MS 10/09/2017, 2:46 AM

## 2017-10-09 NOTE — ED Notes (Signed)
Charge nurse Clarise Cruz) made aware of patient's complaint of increased chest tightness; in attempts to secure room for patient

## 2017-10-09 NOTE — Progress Notes (Signed)
TR BAND REMOVAL  LOCATION:  right radial  DEFLATED PER PROTOCOL:  Yes.    TIME BAND OFF / DRESSING APPLIED:   1750   SITE UPON ARRIVAL:   Level 1  SITE AFTER BAND REMOVAL:  Level 1  CIRCULATION SENSATION AND MOVEMENT:  Within Normal Limits  Yes.    COMMENTS: During site cleaning post band removal, arterial bleed noted.  Applied manual pressure X 20 minutes and dressed site, with additional coban wrap.  Removed wrap at 1930, site level 0.  Some persistent hand swelling noted, expected to resolve post coban removal.

## 2017-10-09 NOTE — ED Provider Notes (Signed)
[facility] EMERGENCY DEPARTMENT Provider Note   CSN: [id number] Arrival date & time: 10/09/17  0001     History   Chief Complaint Chief Complaint  Patient presents with  . Chest Pain    HPI Lucas Norton is a 56 y.o. male.  HPI   56 year old male with history of CAD, NSTEMI status post stenting, tobacco abuse, HLD, HTN presenting with chest pain.  Patient report for the past month he has had recurrent pain to his chest.  He described pain as a pressure crushing sensation across his chest usually brought on when he tries to go to sleep.  Pain has been increasing in frequency and severity prompting him to come here today.  He is currently report 4 out of 10 pain which radiates to left shoulder and tingling sensation down his left arm.  Endorsed a mild headache.  Also endorsed mild shortness of breath which usually accompany his chest pain.  No report of lightheadedness, dizziness, diaphoresis, nausea, abdominal pain.  Denies any strenuous activities or heavy lifting.  Past Medical History:  Diagnosis Date  . Back pain   . CAD in native artery    a. NSTEMI 04/2017 s/p DES to mLAD and Cx, PTCA to D2, EF 50-55%.  Marland Kitchen HLD (hyperlipidemia)   . Hypertension   . NSTEMI (non-ST elevated myocardial infarction) ([place]) 05/21/2017   05/21/17 PCI/DES to mLAD, mLcx. EF normal  . Sinus bradycardia    unable to titrate BB further due to this  . Tobacco abuse     Patient Active Problem List   Diagnosis Date Noted  . CAD in native artery 06/07/2017  . Chest pain 05/31/2017  . Tobacco abuse 05/21/2017  . NSTEMI (non-ST elevated myocardial infarction) ([place]) 05/21/2017  . HTN (hypertension) 05/21/2017  . HLD (hyperlipidemia) 05/21/2017    Past Surgical History:  Procedure Laterality Date  . BACK SURGERY    . CORONARY BALLOON ANGIOPLASTY N/A 05/21/2017   Procedure: Coronary Balloon Angioplasty;  Surgeon: Jettie Booze, MD;  Location: [facility];  Service:  Cardiovascular;  Laterality: N/A;  Mid Diag  . CORONARY STENT INTERVENTION N/A 05/21/2017   Procedure: Coronary Stent Intervention;  Surgeon: Jettie Booze, MD;  Location: [facility];  Service: Cardiovascular;  Laterality: N/A;  Mid LAD,Mid CFX  . LEFT HEART CATH AND CORONARY ANGIOGRAPHY N/A 05/21/2017   Procedure: Left Heart Cath and Coronary Angiography;  Surgeon: Jettie Booze, MD;  Location: [facility];  Service: Cardiovascular;  Laterality: N/A;  . PARTIAL GASTRECTOMY         Home Medications    Prior to Admission medications   Medication Sig Start Date End Date Taking? Authorizing Provider  aspirin 81 MG chewable tablet Chew 1 tablet (81 mg total) by mouth daily. 05/23/17   Cheryln Manly, NP  atorvastatin (LIPITOR) 80 MG tablet Take 1 tablet (80 mg total) by mouth daily at 6 PM. 05/22/17   Cheryln Manly, NP  clopidogrel (PLAVIX) 75 MG tablet Take 1 tablet (75 mg total) by mouth daily. 06/01/17 06/01/18  Leanor Kail, PA  hydrochlorothiazide (MICROZIDE) 12.5 MG capsule Take 1 capsule (12.5 mg total) by mouth daily. 06/20/17 09/18/17  Dunn, Nedra Hai, PA-C  isosorbide mononitrate (IMDUR) 30 MG 24 hr tablet Take 1 tablet (30 mg total) by mouth daily. 06/02/17   Leanor Kail, PA  losartan (COZAAR) 100 MG tablet Take 1 tablet (100 mg total) daily by mouth. 09/11/17 09/09/18  Jenkins Rouge  C, MD  metoprolol tartrate (LOPRESSOR) 25 MG tablet Take 0.5 tablets (12.5 mg total) by mouth 2 (two) times daily. 05/22/17   Cheryln Manly, NP  nitroGLYCERIN (NITROSTAT) 0.4 MG SL tablet Place 1 tablet (0.4 mg total) under the tongue every 5 (five) minutes as needed. 05/22/17   Cheryln Manly, NP    Family History Family History  Problem Relation Age of Onset  . Hypertension Father     Social History Social History   Tobacco Use  . Smoking status: Current Every Day Smoker    Packs/day: 1.00    Years: 20.00    Pack years: 20.00    Types:  Cigarettes  . Smokeless tobacco: Never Used  Substance Use Topics  . Alcohol use: Yes    Comment: socially  . Drug use: No     Allergies   Brilinta [ticagrelor]   Review of Systems Review of Systems  All other systems reviewed and are negative.    Physical Exam Updated Vital Signs BP (!) 168/120 (BP Location: Right Arm)   Pulse 73   Temp 97.8 F (36.6 C) (Oral)   Resp 18   Ht 5\' 10"  (1.778 m)   Wt 86.2 kg (190 lb)   SpO2 99%   BMI 27.26 kg/m   Physical Exam  Constitutional: He is oriented to person, place, and time. He appears well-developed and well-nourished. No distress.  HENT:  Head: Atraumatic.  Eyes: Conjunctivae are normal.  Neck: Neck supple. No JVD present.  Cardiovascular: Normal rate and regular rhythm.  No murmur heard. Pulses:      Radial pulses are 2+ on the right side, and 1+ on the left side.  Pulmonary/Chest: Effort normal and breath sounds normal. He has no decreased breath sounds. He has no wheezes. He has no rhonchi. He has no rales.  Abdominal: Soft. There is no tenderness.  Musculoskeletal:       Right lower leg: He exhibits no edema.       Left lower leg: He exhibits no edema.  Neurological: He is alert and oriented to person, place, and time.  Skin: Capillary refill takes less than 2 seconds. No rash noted.  Psychiatric: He has a normal mood and affect.  Nursing note and vitals reviewed.    ED Treatments / Results  Labs (all labs ordered are listed, but only abnormal results are displayed) Labs Reviewed  BASIC METABOLIC PANEL - Abnormal; Notable for the following components:      Result Value   Creatinine, Ser 1.32 (*)    Calcium 8.7 (*)    GFR calc non Af Amer 59 (*)    All other components within normal limits  TROPONIN I - Abnormal; Notable for the following components:   Troponin I 0.56 (*)    All other components within normal limits  CBC  HEPARIN LEVEL (UNFRACTIONATED)  CBC    EKG  EKG  Interpretation  Date/Time:  Monday October 09 2017 01:40:23 EST Ventricular Rate:  59 PR Interval:  160 QRS Duration: 116 QT Interval:  410 QTC Calculation: 407 R Axis:   79 Text Interpretation:  Sinus rhythm Ventricular premature complex Probable left atrial enlargement Incomplete right bundle branch block No significant change was found Confirmed by Ezequiel Essex [phone]) on 10/09/2017 1:46:11 AM       Radiology Dg Chest 2 View  Result Date: 10/09/2017 CLINICAL DATA:  Chest pain and shortness of breath EXAM: CHEST  2 VIEW COMPARISON:  May 31, 2017 FINDINGS: There  are scattered calcified granulomas. There is no edema or consolidation. Heart size and pulmonary vascularity are normal. No adenopathy. There is aortic atherosclerosis. There are surgical clips at the gastroesophageal junction. IMPRESSION: Scattered calcified granulomas. No edema or consolidation. Stable cardiac silhouette. There is aortic atherosclerosis. Aortic Atherosclerosis (ICD10-I70.0). Electronically Signed   By: Lowella Grip III M.D.   On: 10/09/2017 00:53    Procedures Procedures (including critical care time)  Medications Ordered in ED Medications  aspirin chewable tablet 324 mg (not administered)  nitroGLYCERIN 50 mg in dextrose 5 % 250 mL (0.2 mg/mL) infusion (not administered)  sodium chloride 0.9 % bolus 1,000 mL (not administered)  heparin bolus via infusion 4,000 Units (not administered)  heparin ADULT infusion 100 units/mL (25000 units/256mL sodium chloride 0.45%) (not administered)  morphine 4 MG/ML injection 6 mg (6 mg Intravenous Given 10/09/17 0213)  ondansetron (ZOFRAN) injection 4 mg (4 mg Intravenous Given 10/09/17 0213)     Initial Impression / Assessment and Plan / ED Course  I have reviewed the triage vital signs and the nursing notes.  Pertinent labs & imaging results that were available during my care of the patient were reviewed by me and considered in my medical decision  making (see chart for details).     BP (!) 168/120 (BP Location: Right Arm)   Pulse 73   Temp 97.8 F (36.6 C) (Oral)   Resp 18   Ht 5\' 10"  (1.778 m)   Wt 86.2 kg (190 lb)   SpO2 99%   BMI 27.26 kg/m    Final Clinical Impressions(s) / ED Diagnoses   Final diagnoses:  NSTEMI (non-ST elevated myocardial infarction) [facility])    ED Discharge Orders    None     1:27 AM\ Patient with history of NSTEMI earlier this year requiring DES, presenting with recurrent chest pain.  EKG without acute changes (minimal ST elevation in V2-V3, unchanged from prior), but troponin is elevated at 0.56 consistent with NSTEMI.  Pt given morphine, ASA, Zofran, and started on Nitrodrip.  Will consult cardiology for further management.  Care discussed with Dr. Wyvonnia Dusky.    1:34 AM Pt started in nitro and heparin IV.  WIll consult cardiology for admission  Info from prior note in July 2018:   Mid LAD lesion, 90 %stenosed. A STENT SYNERGY DES 3.5X16 drug eluting stent was successfully placed, postdilated to 3.8 mm.  Mid Cx-2 lesion, 90 %stenosed. A STENT SYNERGY DES 3.5X20 drug eluting stent was successfully placed, postdilated to 3.8 mm in diameter.  Post intervention, there is a 0% residual stenosis.  2nd Diag lesion, 95 %stenosed. This was treated with balloon angioplasty with a 2.5 mm balloon.  Post intervention, there is a 20% residual stenosis.  The left ventricular ejection fraction is 50-55% by visual estimate.  The left ventricular systolic function is normal.  LV end diastolic pressure is normal.There is no aortic valve stenosis.  2:12 AM Appreciate consultation from cardiology Dr. Eula Fried, who agrees to see pt in the ER and will admit for further management.    CRITICAL CARE Performed by: Domenic Moras Total critical care time: 35 minutes Critical care time was exclusive of separately billable procedures and treating other patients. Critical care was necessary to treat or prevent  imminent or life-threatening deterioration. Critical care was time spent personally by me on the following activities: development of treatment plan with patient and/or surrogate as well as nursing, discussions with consultants, evaluation of patient's response to treatment, examination of patient, obtaining history  from patient or surrogate, ordering and performing treatments and interventions, ordering and review of laboratory studies, ordering and review of radiographic studies, pulse oximetry and re-evaluation of patient's condition.    Domenic Moras, PA-C 10/09/17 0215    Ezequiel Essex, MD 10/09/17 [phone]

## 2017-10-10 LAB — BASIC METABOLIC PANEL
Anion gap: 6 (ref 5–15)
BUN: 13 mg/dL (ref 6–20)
CHLORIDE: 105 mmol/L (ref 101–111)
CO2: 26 mmol/L (ref 22–32)
Calcium: 8.5 mg/dL — ABNORMAL LOW (ref 8.9–10.3)
Creatinine, Ser: 1.16 mg/dL (ref 0.61–1.24)
GFR calc Af Amer: >60 mL/min (ref >60)
GFR calc non Af Amer: >60 mL/min (ref >60)
Glucose, Bld: 102 mg/dL — ABNORMAL HIGH (ref 65–99)
POTASSIUM: 3.9 mmol/L (ref 3.5–5.1)
SODIUM: 137 mmol/L (ref 135–145)

## 2017-10-10 LAB — CBC
HEMATOCRIT: 34.3 % — AB (ref 39.0–52.0)
HEMOGLOBIN: 11.4 g/dL — AB (ref 13.0–17.0)
MCH: 30.8 pg (ref 26.0–34.0)
MCHC: 33.2 g/dL (ref 30.0–36.0)
MCV: 92.7 fL (ref 78.0–100.0)
Platelets: 147 10*3/uL — ABNORMAL LOW (ref 150–400)
RBC: 3.7 MIL/uL — AB (ref 4.22–5.81)
RDW: 13.8 % (ref 11.5–15.5)
WBC: 7.1 10*3/uL (ref 4.0–10.5)

## 2017-10-10 LAB — HEMOGLOBIN A1C
HEMOGLOBIN A1C: 5.2 % (ref 4.8–5.6)
Mean Plasma Glucose: 103 mg/dL

## 2017-10-10 MED ORDER — AMLODIPINE BESYLATE 5 MG PO TABS
5.0000 mg | ORAL_TABLET | Freq: Every day | ORAL | Status: DC
  Administered 2017-10-10: 5 mg via ORAL
  Filled 2017-10-10: qty 1

## 2017-10-10 MED ORDER — AMLODIPINE BESYLATE 5 MG PO TABS: 5.0000 mg | ORAL_TABLET | Freq: Every day | ORAL | 3 refills | Status: DC

## 2017-10-10 NOTE — Progress Notes (Signed)
CARDIAC REHAB PHASE I   PRE:  Rate/Rhythm: 76 SR    BP: sitting 157/98    SaO2:   MODE:  Ambulation: 1000 ft   POST:  Rate/Rhythm: 78 SR    BP: sitting 179/105     SaO2:   Tolerated well. BP very elevated, sts it has been very difficult to control since first MI in July. Good discussion of Plavix, smoking cessation (pt now ready to quit completely), ex, NTG, and CRPII. He is willing to try CRPII. Will send referral to [facility]. Gave pt another fake cigarette. He sts his wife is going to try to quit 10/24/17. Gave resources for both of them. [place], ACSM 10/10/2017 8:34 AM

## 2017-10-10 NOTE — Progress Notes (Signed)
Progress Note  Patient Name: Lucas Norton Date of Encounter: 10/10/2017  Primary Cardiologist: Dr Johnsie Cancel  Subjective   No chest pain; mild dyspnea  Inpatient Medications    Scheduled Meds: . aspirin  81 mg Oral Daily  . atorvastatin  80 mg Oral q1800  . clopidogrel  75 mg Oral Q breakfast  . heparin  5,000 Units Subcutaneous Q8H  . hydrochlorothiazide  12.5 mg Oral QPM  . losartan  100 mg Oral QPM  . metoprolol tartrate  12.5 mg Oral BID  . pantoprazole  40 mg Oral Daily  . sodium chloride flush  3 mL Intravenous Q12H   Continuous Infusions: . sodium chloride     PRN Meds: sodium chloride, acetaminophen, nitroGLYCERIN, ondansetron (ZOFRAN) IV, oxyCODONE, sodium chloride flush   Vital Signs    Vitals:   10/09/17 2210 10/09/17 2211 10/10/17 0200 10/10/17 0440  BP: (!) 155/96 (!) 155/96 (!) 160/88 (!) 144/93  Pulse: (!) 59 60 (!) 51 (!) 50  Resp: 17  (!) 22 (!) 21  Temp:    97.9 F (36.6 C)  TempSrc:    Oral  SpO2: 98%  96% 95%  Weight:      Height:        Intake/Output Summary (Last 24 hours) at 10/10/2017 0740 Last data filed at 10/09/2017 1827 Gross per 24 hour  Intake 1272.05 ml  Output 500 ml  Net 772.05 ml   Filed Weights   10/09/17 0009 10/09/17 0414  Weight: 190 lb (86.2 kg) 195 lb 1.6 oz (88.5 kg)    Telemetry    Sinus with rare PVC- Personally Reviewed  Physical Exam   GEN: WD/WN No acute distress.   Neck: No JVD, supple Cardiac: RRR Respiratory: Clear to auscultation bilaterally; no wheeze GI: Soft, nontender, non-distended, no masses MS: No edema; radial cath site with no hematoma Neuro:  Grossly intact   Labs    Chemistry Recent Labs  Lab 10/09/17 0022 10/09/17 0427 10/09/17 1714 10/10/17 0320  NA 140 141  --  137  K 4.3 4.1  --  3.9  CL 109 111  --  105  CO2 23 23  --  26  GLUCOSE 98 102*  --  102*  BUN 18 16  --  13  CREATININE 1.32* 1.07 1.07 1.16  CALCIUM 8.7* 8.3*  --  8.5*  PROT  --  5.4*  --   --     ALBUMIN  --  3.6  --   --   AST  --  29  --   --   ALT  --  32  --   --   ALKPHOS  --  51  --   --   BILITOT  --  0.5  --   --   GFRNONAA 59* >60 >60 >60  GFRAA >60 >60 >60 >60  ANIONGAP 8 7  --  6     Hematology Recent Labs  Lab 10/09/17 0427 10/09/17 1714 10/10/17 0320  WBC 6.8 7.4 7.1  RBC 3.93* 3.73* 3.70*  HGB 12.0* 11.4* 11.4*  HCT 36.2* 34.7* 34.3*  MCV 92.1 93.0 92.7  MCH 30.5 30.6 30.8  MCHC 33.1 32.9 33.2  RDW 13.8 13.9 13.8  PLT 182 140* 147*    Cardiac Enzymes Recent Labs  Lab 10/09/17 0022 10/09/17 0427 10/09/17 1010 10/09/17 1714  TROPONINI 0.56* 0.55* 0.45* 0.48*    BNP Recent Labs  Lab 10/09/17 0427  BNP 29.8      Radiology  Dg Chest 2 View  Result Date: 10/09/2017 CLINICAL DATA:  Chest pain and shortness of breath EXAM: CHEST  2 VIEW COMPARISON:  May 31, 2017 FINDINGS: There are scattered calcified granulomas. There is no edema or consolidation. Heart size and pulmonary vascularity are normal. No adenopathy. There is aortic atherosclerosis. There are surgical clips at the gastroesophageal junction. IMPRESSION: Scattered calcified granulomas. No edema or consolidation. Stable cardiac silhouette. There is aortic atherosclerosis. Aortic Atherosclerosis (ICD10-I70.0). Electronically Signed   By: Lowella Grip III M.D.   On: 10/09/2017 00:53    Patient Profile     56 y.o. male with h/o PCI, D2, Lcx with NSTEMI  Assessment & Plan    1 NSTEMI-s/p PCI of Lcx. Continue ASA, plavix, statin and metoprolol.  2 tobacco abuse-pt again counseled on discontinuing.  3 Hypertension-BP elevated; add norvasc 5 mg daily and follow.  4 Hyperlipidemia-continue lipitor.  Plan DC today and fu with Dr Johnsie Cancel in Jan as scheduled.  > 30 min PA and physician time D2  For questions or updates, please contact [facility] Please consult [email or web address] for contact info under Cardiology/STEMI.      Signed, Kirk Ruths, MD  10/10/2017, 7:40  AM

## 2017-10-10 NOTE — Discharge Summary (Signed)
Discharge Summary    Patient ID: Lucas Norton,  MRN: [id number], DOB/AGE: [birth year] 56 y.o.  Admit date: 10/09/2017 Discharge date: 10/10/2017   Primary Care Provider: Patient, No Pcp Per Primary Cardiologist: Dr. Johnsie Cancel  Discharge Diagnoses    Principal Problem:   Chest pain Active Problems:   Tobacco abuse   NSTEMI (non-ST elevated myocardial infarction) (HCC)   HTN (hypertension)   HLD (hyperlipidemia)   CAD in native artery   Heart burn   Allergies Allergies  Allergen Reactions  . Brilinta [Ticagrelor] Shortness Of Breath     History of Present Illness     This is a 56 y.o. Caucasian male with known medical history of NSTEMI 04/2017 s/p DES to mLAD and Cx, PTCA to D2, EF 50-55%, sinus bradycardia, hyperlipidemia, hypertension, partial gastrectomy, ongoing tobacco abuse presented with episode of chest pain. Patient describes the chest pain as substernal pressure, 4-5/10 in intensity, ongoing frequently for the last 1 month, radiates down to the left shoulder with some tingling in the left arm.  This occurred when patient was having intercourse.  It is associated with mild headache, shortness of breath.  Patient denied having any syncope, dizziness, nausea, vomiting, abdominal pain, diaphoresis, paroxysmal nocturnal dyspnea, prior pulmonary embolism, prior DVT, prolonged bed ridden status, limitation for his usual work.  Patient is also describing a second burning throat pain.  Which starts usually after eating food at night.  He said that the chest pain is usually associated with this burning throat pain.  This may occur 1-2 hours after eating while he is laying down.  This usually occurs at night.  He has not noticed similar pain during the day He presented with a similar prodrome and symptoms in July 2018 and underwent left heart cath with PCI to mid LAD and mid circumflex and D2 by Dr. Irish Lack.  Of note patient has baseline sinus bradycardia and was not able to  tolerate beta-blocker in the past.  He was a started on ticagrelor but was switched to clopidogrel due to dyspnea.  His last LDL was 59.  Patient was started on ARB/diuretic combo because of his elevated blood pressure.  Today he presented with stable hemodynamics but with elevated blood pressure with systolics in 076A.  However patient is not completely chest pain-free and reports 2/10 chest pain.  Is currently on nitroglycerin drip.  Hospital Course     Consultants: none  [name] was admitted to cardiology. Troponins peaked at 0.56. He was maintained on nitro and heparin drips. He was hypertensive on arrival. He underwent heart catheterization on 10/09/17 with DES to mid Cx. LAD stent and diagonal angioplasty sites were widely patent.   Following his previous stents (04/2017), he was transitioned from brilinta to plavix for SOB. He will discharge with continuation of his home ASA and plavix. He continued on his home low dose lopressor.   HTN Continue HCTZ and losartan. Pt will keep BP log and bring to OP clinic for titration of medications. Imdur was D/C'ed, norvasc 5 mg added.  HLD Continue high dose lipitor.  Tobacco abuse Counseled on cessation.  Patient seen and examined by Dr. Stanford Breed today and was stable for discharge. All follow up has been arranged.  _____________  Discharge Vitals Blood pressure (!) 144/93, pulse (!) 50, temperature 97.9 F (36.6 C), temperature source Oral, resp. rate (!) 21, height 5' 10" (1.778 m), weight 195 lb 1.6 oz (88.5 kg), SpO2 95 %.  Filed Weights   10/09/17  0009 10/09/17 0414  Weight: 190 lb (86.2 kg) 195 lb 1.6 oz (88.5 kg)    Labs & Radiologic Studies    CBC Recent Labs    10/09/17 1714 10/10/17 0320  WBC 7.4 7.1  HGB 11.4* 11.4*  HCT 34.7* 34.3*  MCV 93.0 92.7  PLT 140* 355*   Basic Metabolic Panel Recent Labs    10/09/17 0427 10/09/17 1714 10/10/17 0320  NA 141  --  137  K 4.1  --  3.9  CL 111  --  105  CO2 23  --   26  GLUCOSE 102*  --  102*  BUN 16  --  13  CREATININE 1.07 1.07 1.16  CALCIUM 8.3*  --  8.5*   Liver Function Tests Recent Labs    10/09/17 0427  AST 29  ALT 32  ALKPHOS 51  BILITOT 0.5  PROT 5.4*  ALBUMIN 3.6   No results for input(s): LIPASE, AMYLASE in the last 72 hours. Cardiac Enzymes Recent Labs    10/09/17 0427 10/09/17 1010 10/09/17 1714  TROPONINI 0.55* 0.45* 0.48*   BNP Invalid input(s): POCBNP D-Dimer No results for input(s): DDIMER in the last 72 hours. Hemoglobin A1C Recent Labs    10/09/17 0427  HGBA1C 5.2   Fasting Lipid Panel No results for input(s): CHOL, HDL, LDLCALC, TRIG, CHOLHDL, LDLDIRECT in the last 72 hours. Thyroid Function Tests Recent Labs    10/09/17 0427  TSH 1.516   _____________  Dg Chest 2 View  Result Date: 10/09/2017 CLINICAL DATA:  Chest pain and shortness of breath EXAM: CHEST  2 VIEW COMPARISON:  May 31, 2017 FINDINGS: There are scattered calcified granulomas. There is no edema or consolidation. Heart size and pulmonary vascularity are normal. No adenopathy. There is aortic atherosclerosis. There are surgical clips at the gastroesophageal junction. IMPRESSION: Scattered calcified granulomas. No edema or consolidation. Stable cardiac silhouette. There is aortic atherosclerosis. Aortic Atherosclerosis (ICD10-I70.0). Electronically Signed   By: Lowella Grip III M.D.   On: 10/09/2017 00:53     Diagnostic Studies/Procedures    Left heart cath 10/09/17:  Prox Cx-1 lesion is 20% stenosed.  Prox Cx-2 lesion is 80% stenosed.  Post intervention, there is a 0% residual stenosis.    Non-ST elevation MI presentation in this gentleman now 5-1/2 months post LAD and mid circumflex DES implantation.  Short left main  Widely patent LAD including the second diagonal which received POBA for diffuse high grade stenosis.  Both the LAD stent and diagonal angioplasty sites are widely patent.  The circumflex stent contains 85%  proximal margin restenosis.  The vessel is otherwise patent with the exception of 20-30% focal stenoses in the proximal and mid vessel with ostial 30-40% narrowing.  Widely patent right coronary artery.  Successful stenting of the mid circumflex from 85% to 0% using a 12 x 3.5 mm Synergy postdilated to 3.75 mm in diameter at 15 atm.  The new stent overlapped the proximal margin of the previously placed stent.  RECOMMENDATIONS:   Continue Plavix and aspirin  IV heparin discontinued  IV nitroglycerin discontinued  Aggressive risk factor modification  Eligible for discharge 10/10/17.   Echo 10/09/17: Study Conclusions - Left ventricle: The cavity size was normal. Wall thickness was   normal. Systolic function was normal. The estimated ejection   fraction was in the range of 60% to 65%. Wall motion was normal;   there were no regional wall motion abnormalities. Doppler   parameters are consistent with abnormal left ventricular  relaxation (grade 1 diastolic dysfunction). - Aortic valve: Trileaflet; mildly thickened, mildly calcified   leaflets. - Mitral valve: There was mild regurgitation.   Disposition   Pt is being discharged home today in good condition.  Follow-up Plans & Appointments    Follow-up Information    Josue Hector, MD Follow up on 11/16/2017.   Specialty:  Cardiology Why:  4:15 pm for hopsital follow up Contact information: [address] [place] [address] [phone]          Discharge Instructions    Amb Referral to Cardiac Rehabilitation   Complete by:  As directed    Diagnosis:   Coronary Stents PTCA     Diet - low sodium heart healthy   Complete by:  As directed    Discharge instructions   Complete by:  As directed    No driving for 2 days. No lifting over 5 lbs for 1 week. No sexual activity for 1 week. Keep procedure site clean & dry. If you notice increased pain, swelling, bleeding or pus, call/return!  You  may shower, but no soaking baths/hot tubs/pools for 1 week.   Increase activity slowly   Complete by:  As directed       Discharge Medications   Allergies as of 10/10/2017      Reactions   Brilinta [ticagrelor] Shortness Of Breath      Medication List    STOP taking these medications   isosorbide mononitrate 30 MG 24 hr tablet Commonly known as:  IMDUR     TAKE these medications   amLODipine 5 MG tablet Commonly known as:  NORVASC Take 1 tablet (5 mg total) by mouth daily. Start taking on:  10/11/2017   aspirin 81 MG chewable tablet Chew 1 tablet (81 mg total) by mouth daily. What changed:  when to take this   atorvastatin 80 MG tablet Commonly known as:  LIPITOR Take 1 tablet (80 mg total) by mouth daily at 6 PM. What changed:  when to take this   clopidogrel 75 MG tablet Commonly known as:  PLAVIX Take 1 tablet (75 mg total) by mouth daily.   hydrochlorothiazide 12.5 MG capsule Commonly known as:  MICROZIDE Take 12.5 mg by mouth every evening.   losartan 100 MG tablet Commonly known as:  COZAAR Take 1 tablet (100 mg total) daily by mouth. What changed:  when to take this   metoprolol tartrate 25 MG tablet Commonly known as:  LOPRESSOR Take 0.5 tablets (12.5 mg total) by mouth 2 (two) times daily.   nitroGLYCERIN 0.4 MG SL tablet Commonly known as:  NITROSTAT Place 1 tablet (0.4 mg total) under the tongue every 5 (five) minutes as needed. What changed:  reasons to take this   TYLENOL PO Take 4 tablets by mouth daily as needed (pain/headache).        Aspirin prescribed at discharge?  Yes High Intensity Statin Prescribed? (Lipitor 40-65m or Crestor 20-447m: Yes Beta Blocker Prescribed? Yes For EF <40%, was ACEI/ARB Prescribed? Yes ADP Receptor Inhibitor Prescribed? (i.e. Plavix etc.-Includes Medically Managed Patients): Yes For EF <40%, Aldosterone Inhibitor Prescribed? No: normal ef Was EF assessed during THIS hospitalization? Yes Was Cardiac  Rehab II ordered? (Included Medically managed Patients): Yes   Outstanding Labs/Studies   Follow BP at home  Duration of Discharge Encounter   Greater than 30 minutes including physician time.  Signed, AnTami Linuke PA-C 10/10/2017, 10:31 AM

## 2017-10-12 ENCOUNTER — Telehealth (HOSPITAL_COMMUNITY): Payer: Self-pay

## 2017-10-12 NOTE — Telephone Encounter (Signed)
Patients insurance is active and benefits verified through [facility] - No co-pay, deductible amount of $1,500/$59.72 has been met, out of pocket amount of $7,350/$7,266.55 has been met, 20% co-insurance, and no pre-authorization is required. Passport/reference [id number]  Patient will be contacted and scheduled after their follow up appt with the Cardiologist office upon review by the RN Navigator.

## 2017-11-15 NOTE — Progress Notes (Signed)
Cardiology Office Note    Date:  11/16/2017  ID:  Lucas Norton, DOB Feb 13, 1961, MRN [id number] PCP:  Patient, No Pcp Per  Cardiologist:  Dr. Johnsie Cancel   Chief Complaint: f/u CAD  History of Present Illness:  Lucas Norton is a 57 y.o. male originally from [place] with history of recently diagnosed CAD (NSTEMI 04/2017 s/p DES to mLAD and LCx, PTCA to D2, EF 50-55%), HTN, HLD, tobacco abuse, baseline bradycardia He was admitted 7/29-7/30 with NSTEMI. Underwent LHC with Dr. Irish Lack  with PCI/DES to mLAD, and mLcx, EF noted at 50-55% by LV gram  Repeat cath 10/09/17 for NESTMI widely patent LAD stent and POB D1 sight had re intervention Circumflex stent for proximal margin stenosis   He has had coughing and dyspnea despite stopping Brilinta Not sure but thinks its one of his other meds  Past Medical History:  Diagnosis Date  . Back pain   . CAD in native artery    a. NSTEMI 04/2017 s/p DES to mLAD and Cx, PTCA to D2, EF 50-55%.  Marland Kitchen HLD (hyperlipidemia)   . Hypertension   . NSTEMI (non-ST elevated myocardial infarction) ([place]) 05/21/2017   05/21/17 PCI/DES to mLAD, mLcx. EF normal  . Sinus bradycardia    unable to titrate BB further due to this  . Tobacco abuse     Past Surgical History:  Procedure Laterality Date  . BACK SURGERY    . CORONARY BALLOON ANGIOPLASTY N/A 05/21/2017   Procedure: Coronary Balloon Angioplasty;  Surgeon: Jettie Booze, MD;  Location: [place] [facility];  Service: Cardiovascular;  Laterality: N/A;  Mid Diag  . CORONARY STENT INTERVENTION N/A 05/21/2017   Procedure: Coronary Stent Intervention;  Surgeon: Jettie Booze, MD;  Location: [place] [facility];  Service: Cardiovascular;  Laterality: N/A;  Mid LAD,Mid CFX  . CORONARY STENT INTERVENTION N/A 10/09/2017   Procedure: CORONARY STENT INTERVENTION;  Surgeon: Belva Crome, MD;  Location: [place] [facility];  Service: Cardiovascular;  Laterality: N/A;  . LEFT HEART CATH AND CORONARY ANGIOGRAPHY  N/A 05/21/2017   Procedure: Left Heart Cath and Coronary Angiography;  Surgeon: Jettie Booze, MD;  Location: [place] [facility];  Service: Cardiovascular;  Laterality: N/A;  . LEFT HEART CATH AND CORONARY ANGIOGRAPHY N/A 10/09/2017   Procedure: LEFT HEART CATH AND CORONARY ANGIOGRAPHY;  Surgeon: Belva Crome, MD;  Location: [place] [facility];  Service: Cardiovascular;  Laterality: N/A;  . PARTIAL GASTRECTOMY    . ULTRASOUND GUIDANCE FOR VASCULAR ACCESS  10/09/2017   Procedure: Ultrasound Guidance For Vascular Access;  Surgeon: Belva Crome, MD;  Location: [place] [facility];  Service: Cardiovascular;;    Current Medications: Current Meds  Medication Sig  . amLODipine (NORVASC) 5 MG tablet Take 1 tablet (5 mg total) by mouth daily.  . ASPIRIN 81 PO Take 1 tablet by mouth daily.  Marland Kitchen atorvastatin (LIPITOR) 80 MG tablet Take 80 mg by mouth daily. Takes at night.  . clopidogrel (PLAVIX) 75 MG tablet Take 1 tablet (75 mg total) by mouth daily.  . hydrochlorothiazide (MICROZIDE) 12.5 MG capsule Take 12.5 mg by mouth every evening.  . isosorbide mononitrate (IMDUR) 30 MG 24 hr tablet Take 30 mg by mouth daily.  . metoprolol tartrate (LOPRESSOR) 25 MG tablet Take 0.5 tablets (12.5 mg total) by mouth 2 (two) times daily.  . nitroGLYCERIN (NITROSTAT) 0.4 MG SL tablet Place 0.4 mg under the tongue every 5 (five) minutes as needed for chest pain.  . [  DISCONTINUED] losartan (COZAAR) 100 MG tablet Take 100 mg by mouth daily. Takes in the evening     Allergies:   Brilinta [ticagrelor]   Social History   Socioeconomic History  . Marital status: Married    Spouse name: None  . Number of children: None  . Years of education: None  . Highest education level: None  Social Needs  . Financial resource strain: None  . Food insecurity - worry: None  . Food insecurity - inability: None  . Transportation needs - medical: None  . Transportation needs - non-medical: None  Occupational History    . None  Tobacco Use  . Smoking status: Current Every Day Smoker    Packs/day: 1.00    Years: 20.00    Pack years: 20.00    Types: Cigarettes  . Smokeless tobacco: Never Used  Substance and Sexual Activity  . Alcohol use: Yes    Comment: socially  . Drug use: No  . Sexual activity: None  Other Topics Concern  . None  Social History Narrative  . None     Family History:  Family History  Problem Relation Age of Onset  . Hypertension Father     ROS:   Please see the history of present illness. All other systems are reviewed and otherwise negative.    PHYSICAL EXAM:   VS:  BP (!) 140/100   Pulse 64   Ht 5\' 10"  (1.778 m)   Wt 194 lb 4 oz (88.1 kg)   SpO2 98%   BMI 27.87 kg/m   BMI: Body mass index is 27.87 kg/m. Affect appropriate Healthy:  appears stated age 68: normal Neck supple with no adenopathy JVP normal no bruits no thyromegaly Lungs clear with no wheezing and good diaphragmatic motion Heart:  S1/S2 no murmur, no rub, gallop or click PMI normal Abdomen: benighn, BS positve, no tenderness, no AAA no bruit.  No HSM or HJR Distal pulses intact with no bruits No edema Neuro non-focal Skin warm and dry No muscular weakness   Wt Readings from Last 3 Encounters:  11/16/17 194 lb 4 oz (88.1 kg)  10/09/17 195 lb 1.6 oz (88.5 kg)  09/11/17 188 lb 8 oz (85.5 kg)      Studies/Labs Reviewed:   EKG:  06/01/17 SR rate 75 PVC inferior T wave inversions    Recent Labs: 10/09/2017: ALT 32; B Natriuretic Peptide 29.8; TSH 1.516 10/10/2017: BUN 13; Creatinine, Ser 1.16; Hemoglobin 11.4; Platelets 147; Potassium 3.9; Sodium 137   Lipid Panel    Component Value Date/Time   CHOL 119 07/05/2017 0857   TRIG 76 07/05/2017 0857   HDL 39 (L) 07/05/2017 0857   CHOLHDL 3.1 07/05/2017 0857   CHOLHDL 3.5 05/22/2017 0214   VLDL 22 05/22/2017 0214   LDLCALC 65 07/05/2017 0857    Additional studies/ records that were reviewed today include: Cath films 05/21/17  and 10/09/17     ASSESSMENT & PLAN:   1. CAD - stable on medical Rx continue DAT indefinitely 09/2017 re intervention to mid             Circumflex stent with patent LAD stent and POB area of D1 2. Essential HTN - will stop ARB see if cough and dyspnea better If not after 8 weeks will       Try to stop norvasc  3. Hyperlipidemia - LDL at goal 65 07/05/17 normal LFTls continue statin  4. Tobacco abuse - importance of cessation recently reinforced. Done to 3-4/day  Disposition: F/u with me in 6 months    Jenkins Rouge

## 2017-11-16 ENCOUNTER — Encounter: Payer: Self-pay | Admitting: Cardiovascular Disease

## 2017-11-16 ENCOUNTER — Ambulatory Visit (INDEPENDENT_AMBULATORY_CARE_PROVIDER_SITE_OTHER): Payer: 59 | Admitting: Cardiovascular Disease

## 2017-11-16 VITALS — BP 140/100 | HR 64 | Ht 70.0 in | Wt 194.2 lb

## 2017-11-16 DIAGNOSIS — I10 Essential (primary) hypertension: Secondary | ICD-10-CM

## 2017-11-16 DIAGNOSIS — I2510 Atherosclerotic heart disease of native coronary artery without angina pectoris: Secondary | ICD-10-CM

## 2017-11-16 DIAGNOSIS — E785 Hyperlipidemia, unspecified: Secondary | ICD-10-CM | POA: Diagnosis not present

## 2017-11-16 NOTE — Patient Instructions (Addendum)
Medication Instructions:  Your physician has recommended you make the following change in your medication:  1-STOP Cozaar  Labwork: NONE  Testing/Procedures: NONE  Follow-Up: Your physician wants you to follow-up in: 8 to 10 weeks with Dr. Johnsie Cancel.    If you need a refill on your cardiac medications before your next appointment, please call your pharmacy.

## 2017-11-20 ENCOUNTER — Telehealth (HOSPITAL_COMMUNITY): Payer: Self-pay

## 2017-11-20 NOTE — Telephone Encounter (Signed)
Called to speak with patient about Cardiac Rehab - Patient is not interested in the program. Patient stated it will not work with his work schedule and he's exercising on his own. Closed referral.

## 2017-12-21 ENCOUNTER — Other Ambulatory Visit: Payer: Self-pay | Admitting: Physician Assistant

## 2018-01-17 NOTE — Progress Notes (Signed)
Cardiology Office Note    Date:  01/22/2018  ID:  Oneil Behney, DOB June 24, 1961, MRN [id number] PCP:  Patient, No Pcp Per  Cardiologist:  Dr. Johnsie Cancel   Chief Complaint: f/u CAD  History of Present Illness:   57 y.o. f/u CAD. SEMI July 2018 with DES to mLAD and Circumflex. POB D2. EF at that time 50-55%. Recurrent chest pain and SEMI 10/09/17 POB D1 and re intervention to circumflex stent for proximal margin stenosis. CRF;s HTN, HLD and smoking Baseline low HR Last visit  11/16/17 coughing and dyspnea despite stopping Brilinta tried to hold ARB to see if it improved  He has not been taking BP at home high in office today Weight is up Trying to work out more but stopped Smoking and snacks more   No angina some dyspnea   Past Medical History:  Diagnosis Date  . Back pain   . CAD in native artery    a. NSTEMI 04/2017 s/p DES to mLAD and Cx, PTCA to D2, EF 50-55%.  Marland Kitchen HLD (hyperlipidemia)   . Hypertension   . NSTEMI (non-ST elevated myocardial infarction) ([place]) 05/21/2017   05/21/17 PCI/DES to mLAD, mLcx. EF normal  . Sinus bradycardia    unable to titrate BB further due to this  . Tobacco abuse     Past Surgical History:  Procedure Laterality Date  . BACK SURGERY    . CORONARY BALLOON ANGIOPLASTY N/A 05/21/2017   Procedure: Coronary Balloon Angioplasty;  Surgeon: Jettie Booze, MD;  Location: [place] [facility];  Service: Cardiovascular;  Laterality: N/A;  Mid Diag  . CORONARY STENT INTERVENTION N/A 05/21/2017   Procedure: Coronary Stent Intervention;  Surgeon: Jettie Booze, MD;  Location: [place] [facility];  Service: Cardiovascular;  Laterality: N/A;  Mid LAD,Mid CFX  . CORONARY STENT INTERVENTION N/A 10/09/2017   Procedure: CORONARY STENT INTERVENTION;  Surgeon: Belva Crome, MD;  Location: [place] [facility];  Service: Cardiovascular;  Laterality: N/A;  . LEFT HEART CATH AND CORONARY ANGIOGRAPHY N/A 05/21/2017   Procedure: Left Heart Cath and Coronary Angiography;   Surgeon: Jettie Booze, MD;  Location: [place] [facility];  Service: Cardiovascular;  Laterality: N/A;  . LEFT HEART CATH AND CORONARY ANGIOGRAPHY N/A 10/09/2017   Procedure: LEFT HEART CATH AND CORONARY ANGIOGRAPHY;  Surgeon: Belva Crome, MD;  Location: [place] [facility];  Service: Cardiovascular;  Laterality: N/A;  . PARTIAL GASTRECTOMY    . ULTRASOUND GUIDANCE FOR VASCULAR ACCESS  10/09/2017   Procedure: Ultrasound Guidance For Vascular Access;  Surgeon: Belva Crome, MD;  Location: [place];  Service: Cardiovascular;;    Current Medications: Current Meds  Medication Sig  . amLODipine (NORVASC) 5 MG tablet Take 1 tablet (5 mg total) by mouth daily.  . ASPIRIN 81 PO Take 1 tablet by mouth daily.  Marland Kitchen atorvastatin (LIPITOR) 80 MG tablet Take 80 mg by mouth daily. Takes at night.  . clopidogrel (PLAVIX) 75 MG tablet Take 1 tablet (75 mg total) by mouth daily.  . hydrochlorothiazide (MICROZIDE) 12.5 MG capsule Take 12.5 mg by mouth every evening.  . isosorbide mononitrate (IMDUR) 30 MG 24 hr tablet Take 30 mg by mouth daily.  . nitroGLYCERIN (NITROSTAT) 0.4 MG SL tablet Place 0.4 mg under the tongue every 5 (five) minutes as needed for chest pain.     Allergies:   Brilinta [ticagrelor]   Social History   Socioeconomic History  . Marital status: Married  Spouse name: Not on file  . Number of children: Not on file  . Years of education: Not on file  . Highest education level: Not on file  Occupational History  . Not on file  Social Needs  . Financial resource strain: Not on file  . Food insecurity:    Worry: Not on file    Inability: Not on file  . Transportation needs:    Medical: Not on file    Non-medical: Not on file  Tobacco Use  . Smoking status: Current Every Day Smoker    Packs/day: 1.00    Years: 20.00    Pack years: 20.00    Types: Cigarettes  . Smokeless tobacco: Never Used  Substance and Sexual Activity  . Alcohol use: Yes    Comment:  socially  . Drug use: No  . Sexual activity: Not on file  Lifestyle  . Physical activity:    Days per week: Not on file    Minutes per session: Not on file  . Stress: Not on file  Relationships  . Social connections:    Talks on phone: Not on file    Gets together: Not on file    Attends religious service: Not on file    Active member of club or organization: Not on file    Attends meetings of clubs or organizations: Not on file    Relationship status: Not on file  Other Topics Concern  . Not on file  Social History Narrative  . Not on file     Family History:  Family History  Problem Relation Age of Onset  . Hypertension Father     ROS:   Please see the history of present illness. All other systems are reviewed and otherwise negative.    PHYSICAL EXAM:   VS:  BP (!) 162/92   Pulse 68   Ht 5\' 10"  (1.778 m)   Wt 197 lb (89.4 kg)   SpO2 98%   BMI 28.27 kg/m   BMI: Body mass index is 28.27 kg/m. Affect appropriate Healthy:  appears stated age 20: normal Neck supple with no adenopathy JVP normal no bruits no thyromegaly Lungs clear with no wheezing and good diaphragmatic motion Heart:  S1/S2 no murmur, no rub, gallop or click PMI normal Abdomen: benighn, BS positve, no tenderness, no AAA no bruit.  No HSM or HJR Distal pulses intact with no bruits No edema Neuro non-focal Skin warm and dry No muscular weakness    Wt Readings from Last 3 Encounters:  01/22/18 197 lb (89.4 kg)  11/16/17 194 lb 4 oz (88.1 kg)  10/09/17 195 lb 1.6 oz (88.5 kg)      Studies/Labs Reviewed:   EKG:  06/01/17 SR rate 75 PVC inferior T wave inversions    Recent Labs: 10/09/2017: ALT 32; B Natriuretic Peptide 29.8; TSH 1.516 10/10/2017: BUN 13; Creatinine, Ser 1.16; Hemoglobin 11.4; Platelets 147; Potassium 3.9; Sodium 137   Lipid Panel    Component Value Date/Time   CHOL 119 07/05/2017 0857   TRIG 76 07/05/2017 0857   HDL 39 (L) 07/05/2017 0857   CHOLHDL 3.1  07/05/2017 0857   CHOLHDL 3.5 05/22/2017 0214   VLDL 22 05/22/2017 0214   LDLCALC 65 07/05/2017 0857    Additional studies/ records that were reviewed today include: Cath films 05/21/17 and 10/09/17     ASSESSMENT & PLAN:   1. CAD - stable on medical Rx continue DAT indefinitely 09/2017 re intervention to mid  Circumflex stent with patent LAD stent and POB area of D1 2. Essential HTN - monitor at home increase norvasc to 10 mg if needed  3. Hyperlipidemia - LDL at goal 65 07/05/17 normal LFTls continue statin  4. Tobacco abuse - stopped smoking congratulated him on this   Disposition: F/u with me in a year    [employer]

## 2018-01-22 ENCOUNTER — Ambulatory Visit (INDEPENDENT_AMBULATORY_CARE_PROVIDER_SITE_OTHER): Payer: 59 | Admitting: Cardiovascular Disease

## 2018-01-22 ENCOUNTER — Encounter: Payer: Self-pay | Admitting: Cardiovascular Disease

## 2018-01-22 VITALS — BP 162/92 | HR 68 | Ht 70.0 in | Wt 197.0 lb

## 2018-01-22 DIAGNOSIS — I2510 Atherosclerotic heart disease of native coronary artery without angina pectoris: Secondary | ICD-10-CM

## 2018-01-22 DIAGNOSIS — I10 Essential (primary) hypertension: Secondary | ICD-10-CM | POA: Diagnosis not present

## 2018-01-22 DIAGNOSIS — E785 Hyperlipidemia, unspecified: Secondary | ICD-10-CM | POA: Diagnosis not present

## 2018-01-22 NOTE — Patient Instructions (Addendum)

## 2018-03-09 ENCOUNTER — Other Ambulatory Visit: Payer: Self-pay | Admitting: Physician Assistant

## 2018-04-18 IMAGING — CT CT ANGIO CHEST-ABD-PELV FOR DISSECTION W/ AND WO/W CM
2 of 6 series · 13 of 46 positions shown, 15 images · IV contrast (isovue)
Comparison: None.

CLINICAL DATA: Midchest pain radiating to the back for 3 hours.

EXAM:
CT ANGIOGRAPHY CHEST, ABDOMEN AND PELVIS
TECHNIQUE: Multidetector CT imaging through the chest, abdomen and pelvis was
performed using the standard protocol during bolus administration of
intravenous contrast. Multiplanar reconstructed images and MIPs were
obtained and reviewed to evaluate the vascular anatomy.
CONTRAST:  100 mL Isovue 370

[Series 7: dissection 2mm · axial · 0.70mm/px · z∈[+770,+1332]mm · 10 of 333 slices shown, 12 images]
[im 26/333  soft-tissue]
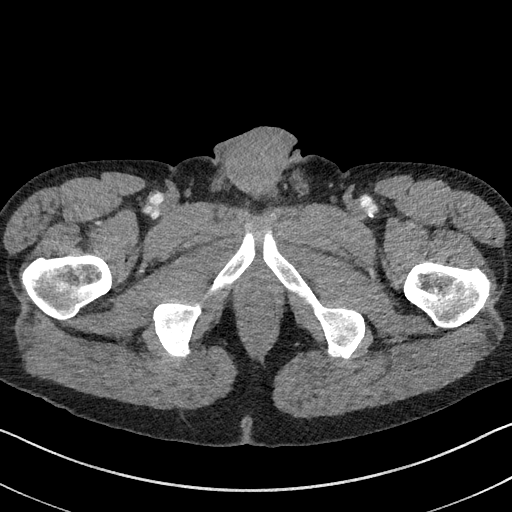
[im 26/333  bone]
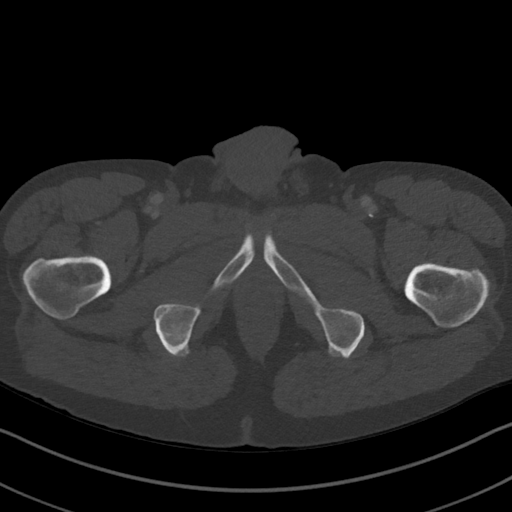
[im 52/333  soft-tissue]
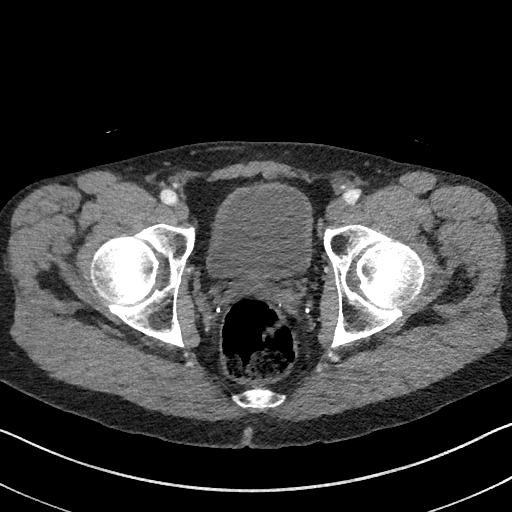
[im 90/333  soft-tissue]
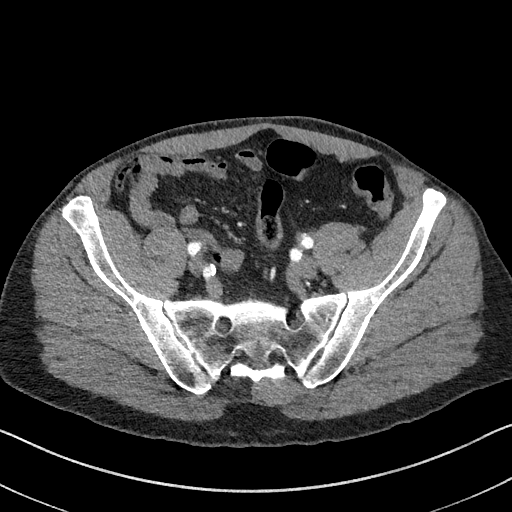
[im 115/333  soft-tissue]
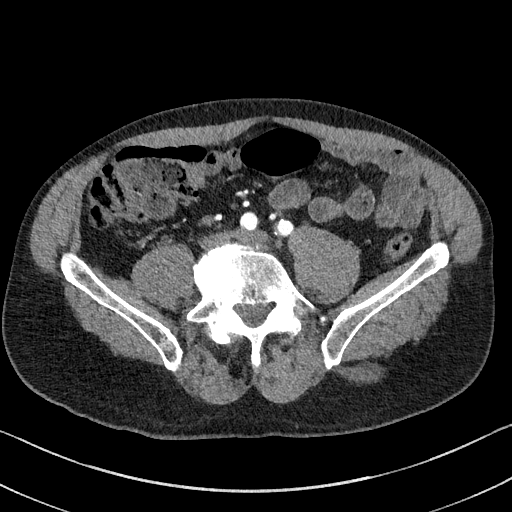
[im 154/333  soft-tissue]
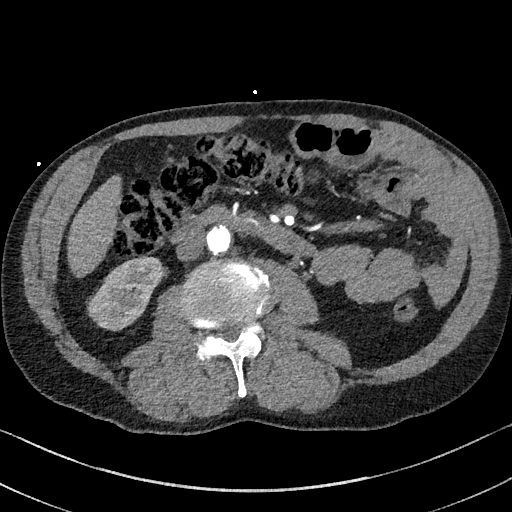
[im 179/333  soft-tissue]
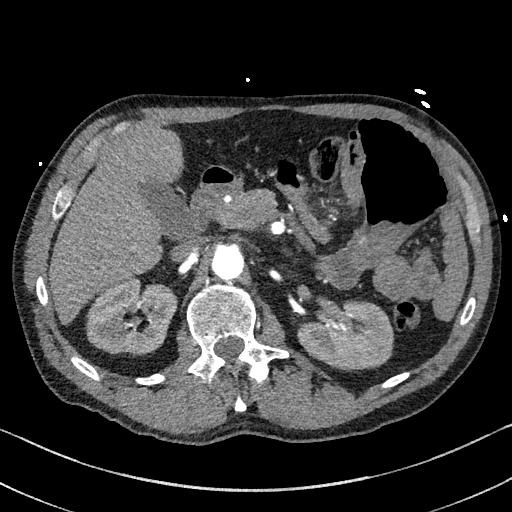
[im 218/333  soft-tissue]
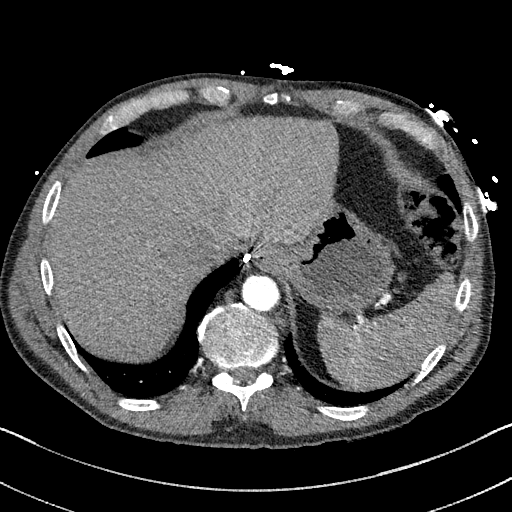
[im 243/333  soft-tissue]
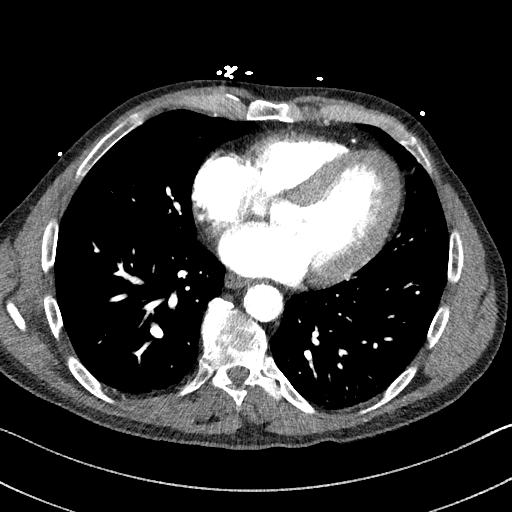
[im 281/333  soft-tissue]
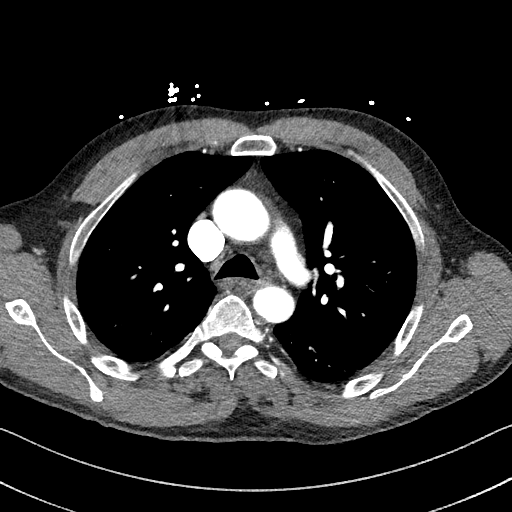
[im 281/333  bone]
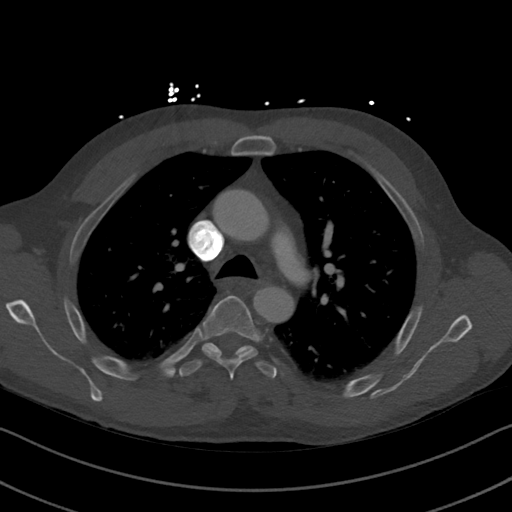
[im 307/333  soft-tissue]
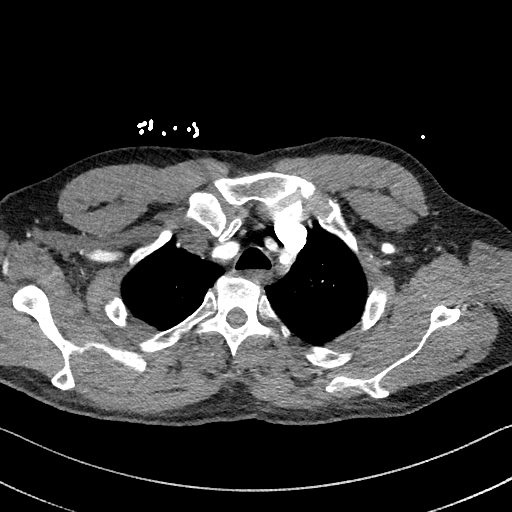

[Series 10: dissection 2mm cor · coronal · 0.73mm/px · 3 of 140 slices shown]
[im 35/140  soft-tissue]
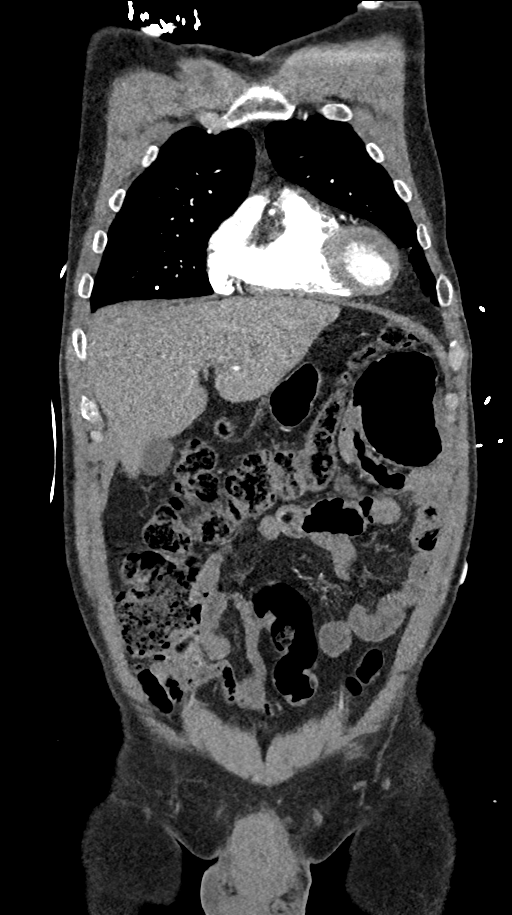
[im 70/140  soft-tissue]
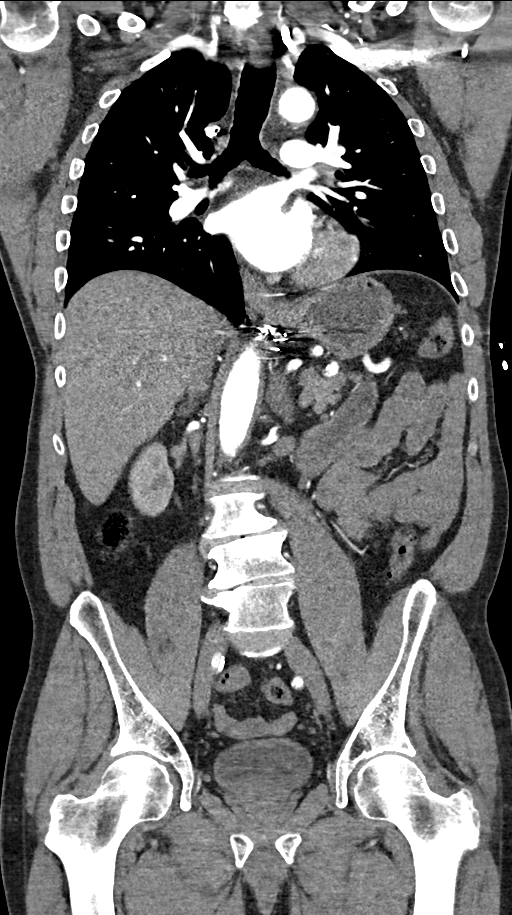
[im 105/140  soft-tissue]
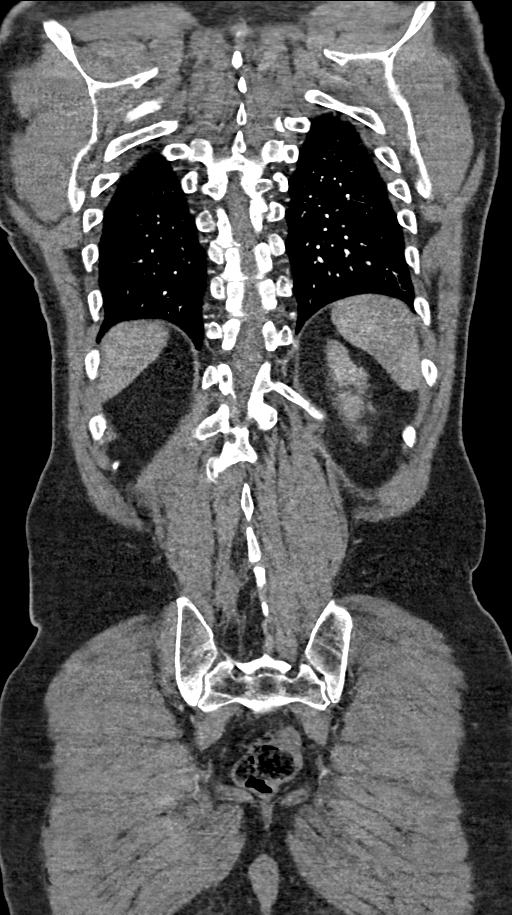

[13 of 46 positions shown; findings below may reference images not displayed]

FINDINGS: CTA CHEST FINDINGS

Cardiovascular: Preferential opacification of the thoracic aorta. No
evidence of thoracic aortic aneurysm or dissection. Normal heart
size. No pericardial effusion. The pulmonary arteries are also well
opacified, with no emboli.

Mediastinum/Nodes: No enlarged mediastinal, hilar, or axillary lymph
nodes. Thyroid gland, trachea, and esophagus demonstrate no
significant findings.

Lungs/Pleura: Lungs are clear. No pleural effusion or pneumothorax.
Incidental calcified granulomata.

Musculoskeletal: No significant skeletal lesions.

Review of the MIP images confirms the above findings.

CTA ABDOMEN AND PELVIS FINDINGS

VASCULAR

Aorta: Normal caliber aorta without aneurysm, dissection, vasculitis
or significant stenosis. Moderate atherosclerotic calcifications and
nonstenotic plaque.

Celiac: Patent without evidence of aneurysm, dissection, vasculitis
or significant stenosis.

SMA: Patent without evidence of aneurysm, dissection, vasculitis or
significant stenosis.

Renals: Both renal arteries are patent without evidence of aneurysm,
dissection, vasculitis, fibromuscular dysplasia or significant
stenosis.

IMA: Patent without evidence of aneurysm, dissection, vasculitis or
significant stenosis.

Inflow: Patent without evidence of aneurysm, dissection, vasculitis
or significant stenosis.

Veins: No obvious venous abnormality within the limitations of this
arterial phase study.

Review of the MIP images confirms the above findings.

NON-VASCULAR

Hepatobiliary: No focal liver abnormality is seen. No gallstones,
gallbladder wall thickening, or biliary dilatation.

Pancreas: Unremarkable. No pancreatic ductal dilatation or
surrounding inflammatory changes.

Spleen: Normal in size without focal abnormality.

Adrenals/Urinary Tract: Adrenal glands are unremarkable. Kidneys are
normal, without renal calculi, focal lesion, or hydronephrosis.
Bladder is unremarkable.

Stomach/Bowel: Stomach has been partially resected, probably with
gastrojejunostomy. Small bowel is otherwise unremarkable. Appendix
is normal. No evidence of bowel wall thickening, distention, or
inflammatory changes.

Lymphatic: No adenopathy in the abdomen or pelvis.

Reproductive: Unremarkable

Other: No focal inflammation.  No ascites.

Musculoskeletal: No significant skeletal lesion. Moderately severe
lumbar degenerative disc changes at L3-4 and L4-5.

Review of the MIP images confirms the above findings.
IMPRESSION: 1. Aortic atherosclerosis.  No evidence of acute aortic syndrome.
2. No acute findings are evident in the chest, abdomen or pelvis.

## 2018-05-08 ENCOUNTER — Other Ambulatory Visit: Payer: Self-pay | Admitting: Physician Assistant

## 2018-08-10 ENCOUNTER — Other Ambulatory Visit: Payer: Self-pay | Admitting: Cardiovascular Disease

## 2018-09-06 IMAGING — CR DG CHEST 2V
2 series · 2 of 2 positions shown · non-contrast
Comparison: May 31, 2017

CLINICAL DATA: Chest pain and shortness of breath

EXAM:
CHEST  2 VIEW

[chest pa]
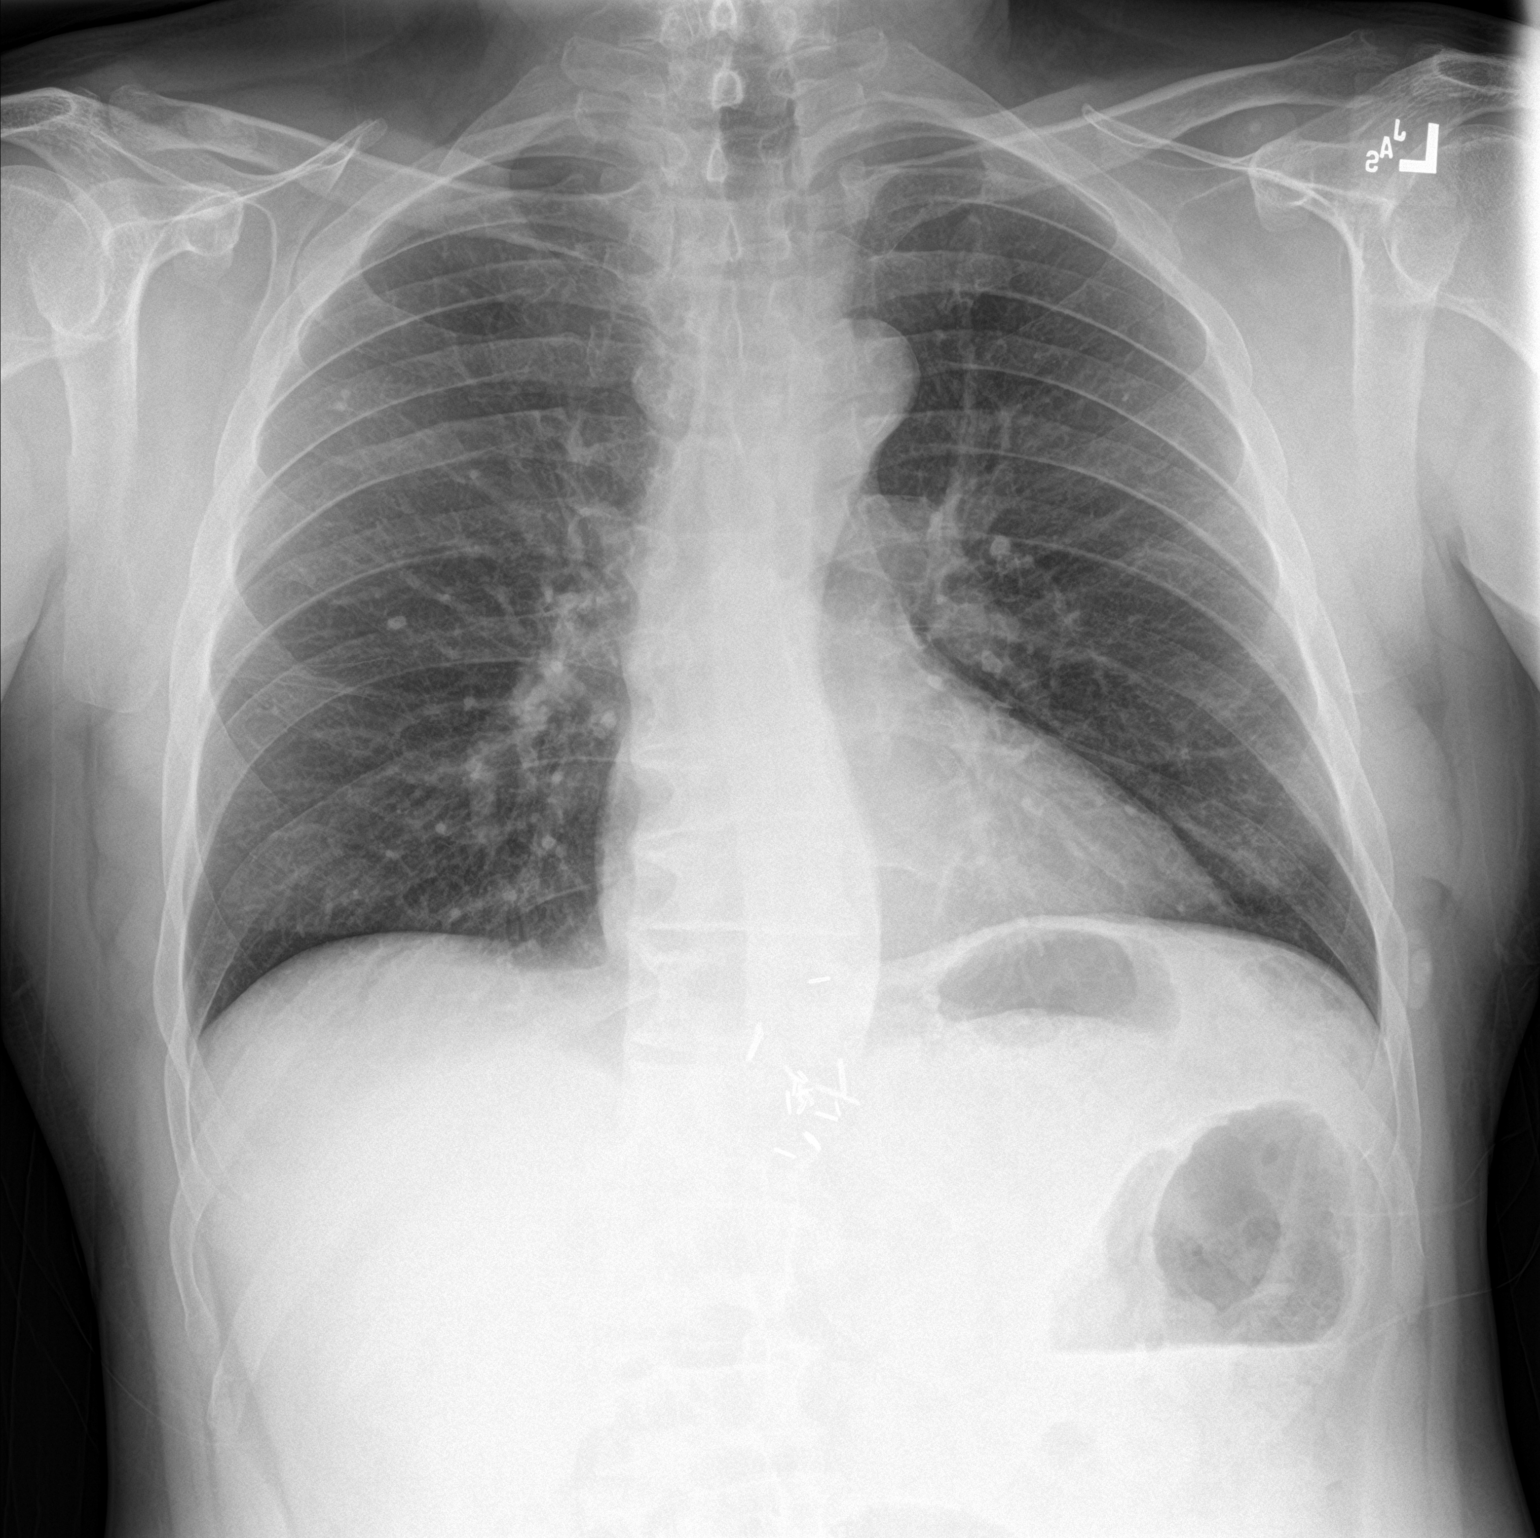

[chest lat]
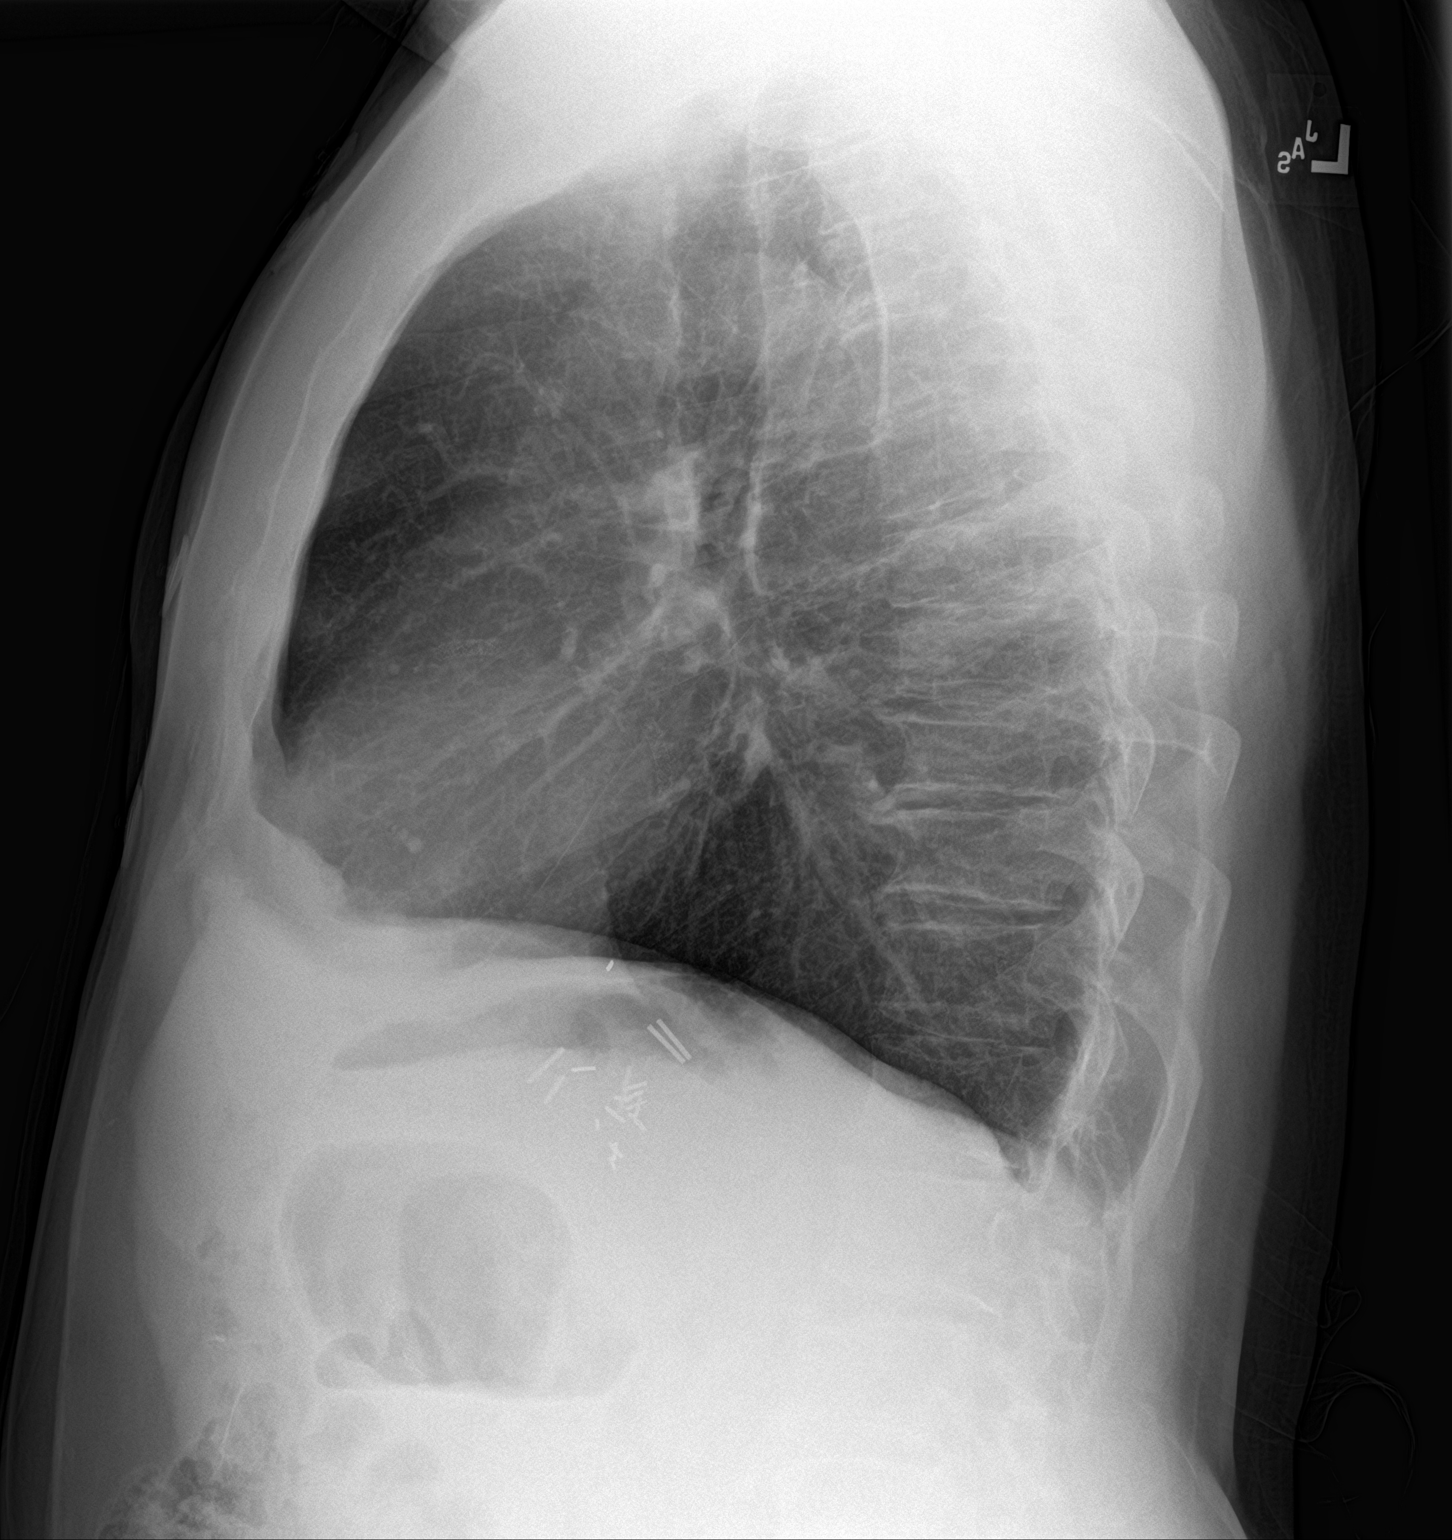

[2 of 2 positions shown; findings below may reference images not displayed]

FINDINGS: There are scattered calcified granulomas. There is no edema or
consolidation. Heart size and pulmonary vascularity are normal. No
adenopathy. There is aortic atherosclerosis. There are surgical
clips at the gastroesophageal junction.
IMPRESSION: Scattered calcified granulomas. No edema or consolidation. Stable
cardiac silhouette. There is aortic atherosclerosis.

Aortic Atherosclerosis ([id number]-[id number].K).

## 2019-01-18 ENCOUNTER — Encounter: Payer: Self-pay | Admitting: Cardiovascular Disease

## 2019-01-22 ENCOUNTER — Telehealth: Payer: Self-pay

## 2019-01-22 NOTE — Telephone Encounter (Signed)
I left a message for the patient to return my call about appointment on 4/7 at 4 pm with Dr. Johnsie Cancel.

## 2019-01-23 ENCOUNTER — Telehealth: Payer: Self-pay

## 2019-01-23 NOTE — Telephone Encounter (Signed)
Patient cannot move his appointment to an earlier time.

## 2019-01-23 NOTE — Telephone Encounter (Signed)
YOUR CARDIOLOGY TEAM HAS ARRANGED FOR AN E-VISIT FOR YOUR APPOINTMENT - PLEASE REVIEW IMPORTANT INFORMATION BELOW SEVERAL DAYS PRIOR TO YOUR APPOINTMENT  Due to the recent COVID-19 pandemic, we are transitioning in-person office visits to tele-medicine visits in an effort to decrease unnecessary exposure to our patients and staff. Medicare and most insurances are covering these visits without a copay needed. We also encourage you to sign up for MyChart if you have not already done so. You will need a smartphone if possible. For patients that do not have this, we can still complete the visit using a regular telephone but do prefer a smartphone to enable video when possible. You may have a close family member that lives with you that can help. If possible, we also ask that you have a blood pressure cuff and scale at home to measure your blood pressure, heart rate and weight prior to your scheduled appointment. Patients with clinical needs that need an in-person evaluation and testing will still be able to come to the office if absolutely necessary. If you have any questions, feel free to call our office.    IF YOU HAVE A SMARTPHONE, PLEASE DOWNLOAD THE WEBEX APP TO YOUR SMARTPHONE  - If Apple, go to App Store and type in WebEx in the search bar. Download [employer] Webex Meetings, the blue/green circle. The app is free but as with any other app download, your phone may require you to verify saved payment information or Apple password. You do NOT have to create a WebEx account.  - If Android, go to Google Play Store and type in WebEx in the search bar. Download [employer] Webex Meetings, the blue/green circle. The app is free but as with any other app download, your phone may require you to verify saved payment information or Android password. You do NOT have to create a WebEx account.  It is very helpful to have this downloaded before your visit.    2-3 DAYS BEFORE YOUR APPOINTMENT  You will receive a  telephone call from one of our [facility] team members - your caller ID may say "Unknown caller." If this is a video visit, we will confirm that you have been able to download the WebEx app. We will remind you check your blood pressure, heart rate and weight prior to your scheduled appointment. If you have an Apple Watch or Kardia, please upload any pertinent ECG strips the day before or morning of your appointment to MyChart. Our staff will also make sure you have reviewed the consent and agree to move forward with your scheduled tele-health visit.     THE DAY OF YOUR APPOINTMENT  Approximately 15 minutes prior to your scheduled appointment, you will receive a telephone call from one of HeartCare team - your caller ID may say "Unknown caller."  Our staff will confirm medications, vital signs for the day and any symptoms you may be experiencing. Please have this information available prior to the time of visit start. It may also be helpful for you to have a pad of paper and pen handy for any instructions given during your visit. They will also walk you through joining the WebEx smartphone meeting if this is a video visit.    CONSENT FOR TELE-HEALTH VISIT - PLEASE REVIEW  I hereby voluntarily request, consent and authorize [facility] and its employed or contracted physicians, physician assistants, nurse practitioners or other licensed health care professionals (the Practitioner), to provide me with telemedicine health care services (the "Services") as   deemed necessary by the treating Practitioner. I acknowledge and consent to receive the Services by the Practitioner via telemedicine. I understand that the telemedicine visit will involve communicating with the Practitioner through live audiovisual communication technology and the disclosure of certain medical information by electronic transmission. I acknowledge that I have been given the opportunity to request an in-person assessment or other available  alternative prior to the telemedicine visit and am voluntarily participating in the telemedicine visit.  I understand that I have the right to withhold or withdraw my consent to the use of telemedicine in the course of my care at any time, without affecting my right to future care or treatment, and that the Practitioner or I may terminate the telemedicine visit at any time. I understand that I have the right to inspect all information obtained and/or recorded in the course of the telemedicine visit and may receive copies of available information for a reasonable fee.  I understand that some of the potential risks of receiving the Services via telemedicine include:  Marland Kitchen Delay or interruption in medical evaluation due to technological equipment failure or disruption; . Information transmitted may not be sufficient (e.g. poor resolution of images) to allow for appropriate medical decision making by the Practitioner; and/or  . In rare instances, security protocols could fail, causing a breach of personal health information.  Furthermore, I acknowledge that it is my responsibility to provide information about my medical history, conditions and care that is complete and accurate to the best of my ability. I acknowledge that Practitioner's advice, recommendations, and/or decision may be based on factors not within their control, such as incomplete or inaccurate data provided by me or distortions of diagnostic images or specimens that may result from electronic transmissions. I understand that the practice of medicine is not an exact science and that Practitioner makes no warranties or guarantees regarding treatment outcomes. I acknowledge that I will receive a copy of this consent concurrently upon execution via email to the email address I last provided but may also request a printed copy by calling the office of [place].    I understand that my insurance will be billed for this visit.   I have read or had  this consent read to me. . I understand the contents of this consent, which adequately explains the benefits and risks of the Services being provided via telemedicine.  . I have been provided ample opportunity to ask questions regarding this consent and the Services and have had my questions answered to my satisfaction. . I give my informed consent for the services to be provided through the use of telemedicine in my medical care  By participating in this telemedicine visit I agree to the above. Yes

## 2019-01-27 NOTE — Progress Notes (Signed)
Virtual Visit via Video Note    Evaluation Performed:  Follow-up visit  This visit type was conducted due to national recommendations for restrictions regarding the COVID-19 Pandemic (e.g. social distancing).  This format is felt to be most appropriate for this patient at this time.  All issues noted in this document were discussed and addressed.  No physical exam was performed (except for noted visual exam findings with Video Visits).  Please refer to the patient's chart (MyChart message for video visits and phone note for telephone visits) for the patient's consent to telehealth for [facility].  Date:  01/27/2019   ID:  Lucas Norton, DOB 1960/12/29, MRN [id number]  Patient Location:  Home  Provider location:   Office  PCP:  Patient, No Pcp Per  Cardiologist:  No primary care provider on file. [place] Electrophysiologist:  None   Chief Complaint:  F/U CAD   History of Present Illness:    Lucas Norton is a 58 y.o. male who presents via audio/video conferencing for a telehealth visit today.   SEMI July 2018 with DES to mLAD and Circumflex. POB D2. EF at that time 50-55%. Recurrent chest pain and SEMI 10/09/17 POB D1 and re intervention to circumflex stent for proximal margin stenosis. CRF;s HTN, HLD and smoking Baseline low HR Last visit  11/16/17 coughing and dyspnea despite stopping Brilinta tried to hold ARB to see if it improved  Weight is up  Trying to work out more but stopped Smoking and snacks more   No angina some dyspnea going up steps. His angina is typically back pain that is severe. He stopped taking his statin and is about to run out of his other meds. Needs a primary care doctor His wife sees Grier Mitts with [facility]    The patient does not have symptoms concerning for COVID-19 infection (fever, chills, cough, or new shortness of breath).    Prior CV studies:   The following studies were reviewed today:  Cath films 05/21/17 and 10/09/17  Echo  10/09/17  Past Medical History:  Diagnosis Date  . Back pain   . CAD in native artery    a. NSTEMI 04/2017 s/p DES to mLAD and Cx, PTCA to D2, EF 50-55%.  Marland Kitchen HLD (hyperlipidemia)   . Hypertension   . NSTEMI (non-ST elevated myocardial infarction) ([place]) 05/21/2017   05/21/17 PCI/DES to mLAD, mLcx. EF normal  . Sinus bradycardia    unable to titrate BB further due to this  . Tobacco abuse    Past Surgical History:  Procedure Laterality Date  . BACK SURGERY    . CORONARY BALLOON ANGIOPLASTY N/A 05/21/2017   Procedure: Coronary Balloon Angioplasty;  Surgeon: Jettie Booze, MD;  Location: [facility];  Service: Cardiovascular;  Laterality: N/A;  Mid Diag  . CORONARY STENT INTERVENTION N/A 05/21/2017   Procedure: Coronary Stent Intervention;  Surgeon: Jettie Booze, MD;  Location: [facility];  Service: Cardiovascular;  Laterality: N/A;  Mid LAD,Mid CFX  . CORONARY STENT INTERVENTION N/A 10/09/2017   Procedure: CORONARY STENT INTERVENTION;  Surgeon: Belva Crome, MD;  Location: [facility];  Service: Cardiovascular;  Laterality: N/A;  . LEFT HEART CATH AND CORONARY ANGIOGRAPHY N/A 05/21/2017   Procedure: Left Heart Cath and Coronary Angiography;  Surgeon: Jettie Booze, MD;  Location: [facility];  Service: Cardiovascular;  Laterality: N/A;  . LEFT HEART CATH AND CORONARY ANGIOGRAPHY N/A 10/09/2017   Procedure: LEFT HEART CATH AND  CORONARY ANGIOGRAPHY;  Surgeon: Belva Crome, MD;  Location: [facility];  Service: Cardiovascular;  Laterality: N/A;  . PARTIAL GASTRECTOMY    . ULTRASOUND GUIDANCE FOR VASCULAR ACCESS  10/09/2017   Procedure: Ultrasound Guidance For Vascular Access;  Surgeon: Belva Crome, MD;  Location: [facility];  Service: Cardiovascular;;     No outpatient medications have been marked as taking for the 01/29/19 encounter (Appointment) with Josue Hector, MD.     Allergies:   Brilinta [ticagrelor]   Social  History   Tobacco Use  . Smoking status: Current Every Day Smoker    Packs/day: 1.00    Years: 20.00    Pack years: 20.00    Types: Cigarettes  . Smokeless tobacco: Never Used  Substance Use Topics  . Alcohol use: Yes    Comment: socially  . Drug use: No     Family Hx: The patient's family history includes Hypertension in his father.  ROS:   Please see the history of present illness.     All other systems reviewed and are negative.   Labs/Other Tests and Data Reviewed:    Recent Labs: No results found for requested labs within last 8760 hours.   Recent Lipid Panel Lab Results  Component Value Date/Time   CHOL 119 07/05/2017 08:57 AM   TRIG 76 07/05/2017 08:57 AM   HDL 39 (L) 07/05/2017 08:57 AM   CHOLHDL 3.1 07/05/2017 08:57 AM   CHOLHDL 3.5 05/22/2017 02:14 AM   LDLCALC 65 07/05/2017 08:57 AM    Wt Readings from Last 3 Encounters:  01/22/18 89.4 kg  11/16/17 88.1 kg  10/09/17 88.5 kg     Objective:    Vital Signs:  There were no vitals taken for this visit.   Well nourished, well developed male in no acute distress. Skin warm and dry  No JVP No edema   ASSESSMENT & PLAN:    1. CAD:  Multiple previous interventions DAT indefinitely. Last cath 09/2017 re intervention to mid circumflex stent and patent LAD stent with POB of D1  New plavix called in  2. HTN:  Well controlled.  Continue current medications and low sodium Dash type diet.    3. HLD:  Checking lipids and other labs since he has no primary will likely need to call lipitor back in if LDL over 75 mg/dl 4. Smoking:  Continues to abstain good   COVID-19 Education: The signs and symptoms of COVID-19 were discussed with the patient and how to seek care for testing (follow up with PCP or arrange E-visit).  The importance of social distancing was discussed today.  Patient Risk:   After full review of this patient's clinical status, I feel that they are at least moderate risk at this time.  Time:    Today, I have spent 30 minutes with the patient with telehealth technology discussing CAD, HLD, HTN and smoking .     Medication Adjustments/Labs and Tests Ordered: Current medicines are reviewed at length with the patient today.  Concerns regarding medicines are outlined above.  Tests Ordered: Labs including lipid and liver PSA/ A1C  Medication Changes: No orders of the defined types were placed in this encounter.   Disposition:  Follow up in a year will try to get him into see [place] [facility], Jenkins Rouge, MD  01/27/2019 2:39 PM    [place]

## 2019-01-29 ENCOUNTER — Other Ambulatory Visit: Payer: Self-pay

## 2019-01-29 ENCOUNTER — Encounter: Payer: Self-pay | Admitting: Cardiovascular Disease

## 2019-01-29 ENCOUNTER — Telehealth (INDEPENDENT_AMBULATORY_CARE_PROVIDER_SITE_OTHER): Payer: 59 | Admitting: Cardiovascular Disease

## 2019-01-29 VITALS — BP 154/113 | Ht 70.0 in | Wt 190.0 lb

## 2019-01-29 DIAGNOSIS — I2510 Atherosclerotic heart disease of native coronary artery without angina pectoris: Secondary | ICD-10-CM

## 2019-01-29 DIAGNOSIS — I10 Essential (primary) hypertension: Secondary | ICD-10-CM

## 2019-01-29 DIAGNOSIS — E785 Hyperlipidemia, unspecified: Secondary | ICD-10-CM

## 2019-01-29 MED ORDER — NITROGLYCERIN 0.4 MG SL SUBL: 0.4000 mg | SUBLINGUAL_TABLET | SUBLINGUAL | 3 refills | Status: DC | PRN

## 2019-01-29 MED ORDER — ISOSORBIDE MONONITRATE ER 30 MG PO TB24: 30.0000 mg | ORAL_TABLET | Freq: Every day | ORAL | 3 refills | Status: DC

## 2019-01-29 MED ORDER — HYDROCHLOROTHIAZIDE 25 MG PO TABS: 25.0000 mg | ORAL_TABLET | Freq: Every day | ORAL | 3 refills | Status: DC

## 2019-01-29 MED ORDER — CLOPIDOGREL BISULFATE 75 MG PO TABS: 75.0000 mg | ORAL_TABLET | Freq: Every day | ORAL | 3 refills | Status: DC

## 2019-01-29 NOTE — Patient Instructions (Signed)
Medication Instructions:   If you need a refill on your cardiac medications before your next appointment, please call your pharmacy.   Lab work: Your physician recommends that you return for lab work in: 4 weeks for fasting lab work, CMET, CBC, Lipid and liver panel, PSA, and HgbA1c  If you have labs (blood work) drawn today and your tests are completely normal, you will receive your results only by: Marland Kitchen MyChart Message (if you have MyChart) OR . A paper copy in the mail If you have any lab test that is abnormal or we need to change your treatment, we will call you to review the results.  Testing/Procedures: None ordered today.  Follow-Up: At [facility], you and your health needs are our priority.  As part of our continuing mission to provide you with exceptional heart care, we have created designated Provider Care Teams.  These Care Teams include your primary Cardiologist (physician) and Advanced Practice Providers (APPs -  Physician Assistants and Nurse Practitioners) who all work together to provide you with the care you need, when you need it. You will need a follow up appointment in 1 years.  Please call our office 2 months in advance to schedule this appointment.  You may see Dr. Johnsie Cancel or one of the following Advanced Practice Providers on your designated Care Team:   Truitt Merle, NP Cecilie Kicks, NP . Kathyrn Drown, NP

## 2019-03-01 ENCOUNTER — Other Ambulatory Visit: Payer: Self-pay

## 2019-03-01 ENCOUNTER — Other Ambulatory Visit: Payer: 59

## 2019-03-01 DIAGNOSIS — E785 Hyperlipidemia, unspecified: Secondary | ICD-10-CM

## 2019-03-01 DIAGNOSIS — I2510 Atherosclerotic heart disease of native coronary artery without angina pectoris: Secondary | ICD-10-CM

## 2019-03-01 DIAGNOSIS — I10 Essential (primary) hypertension: Secondary | ICD-10-CM

## 2019-03-02 LAB — CBC WITH DIFFERENTIAL/PLATELET
Basophils Absolute: 0.1 10*3/uL (ref 0.0–0.2)
Basos: 1 %
EOS (ABSOLUTE): 0.2 10*3/uL (ref 0.0–0.4)
Eos: 3 %
Hematocrit: 46.5 % (ref 37.5–51.0)
Hemoglobin: 15.9 g/dL (ref 13.0–17.7)
Immature Grans (Abs): 0 10*3/uL (ref 0.0–0.1)
Immature Granulocytes: 0 %
Lymphocytes Absolute: 1.6 10*3/uL (ref 0.7–3.1)
Lymphs: 22 %
MCH: 30.6 pg (ref 26.6–33.0)
MCHC: 34.2 g/dL (ref 31.5–35.7)
MCV: 90 fL (ref 79–97)
Monocytes Absolute: 0.5 10*3/uL (ref 0.1–0.9)
Monocytes: 7 %
Neutrophils Absolute: 4.8 10*3/uL (ref 1.4–7.0)
Neutrophils: 67 %
Platelets: 199 10*3/uL (ref 150–450)
RBC: 5.19 x10E6/uL (ref 4.14–5.80)
RDW: 12.5 % (ref 11.6–15.4)
WBC: 7.2 10*3/uL (ref 3.4–10.8)

## 2019-03-02 LAB — COMPREHENSIVE METABOLIC PANEL
ALT: 27 IU/L (ref 0–44)
AST: 27 IU/L (ref 0–40)
Albumin/Globulin Ratio: 2.5 — ABNORMAL HIGH (ref 1.2–2.2)
Albumin: 4.5 g/dL (ref 3.8–4.9)
Alkaline Phosphatase: 72 IU/L (ref 39–117)
BUN/Creatinine Ratio: 10 (ref 9–20)
BUN: 11 mg/dL (ref 6–24)
Bilirubin Total: 0.5 mg/dL (ref 0.0–1.2)
CO2: 23 mmol/L (ref 20–29)
Calcium: 9.5 mg/dL (ref 8.7–10.2)
Chloride: 101 mmol/L (ref 96–106)
Creatinine, Ser: 1.12 mg/dL (ref 0.76–1.27)
GFR calc Af Amer: 84 mL/min/{1.73_m2} (ref >59)
GFR calc non Af Amer: 73 mL/min/{1.73_m2} (ref >59)
Globulin, Total: 1.8 g/dL (ref 1.5–4.5)
Glucose: 100 mg/dL — ABNORMAL HIGH (ref 65–99)
Potassium: 4.5 mmol/L (ref 3.5–5.2)
Sodium: 140 mmol/L (ref 134–144)
Total Protein: 6.3 g/dL (ref 6.0–8.5)

## 2019-03-02 LAB — HEMOGLOBIN A1C
Est. average glucose Bld gHb Est-mCnc: 105 mg/dL
Hgb A1c MFr Bld: 5.3 % (ref 4.8–5.6)

## 2019-03-02 LAB — HEPATIC FUNCTION PANEL: Bilirubin, Direct: 0.14 mg/dL (ref 0.00–0.40)

## 2019-03-02 LAB — LIPID PANEL
Chol/HDL Ratio: 4.7 ratio (ref 0.0–5.0)
Cholesterol, Total: 215 mg/dL — ABNORMAL HIGH (ref 100–199)
HDL: 46 mg/dL (ref >39)
LDL Calculated: 142 mg/dL — ABNORMAL HIGH (ref 0–99)
Triglycerides: 136 mg/dL (ref 0–149)
VLDL Cholesterol Cal: 27 mg/dL (ref 5–40)

## 2019-03-02 LAB — PSA: Prostate Specific Ag, Serum: 1.7 ng/mL (ref 0.0–4.0)

## 2019-03-05 ENCOUNTER — Telehealth: Payer: Self-pay | Admitting: Cardiovascular Disease

## 2019-03-05 DIAGNOSIS — E785 Hyperlipidemia, unspecified: Secondary | ICD-10-CM

## 2019-03-05 MED ORDER — ATORVASTATIN CALCIUM 20 MG PO TABS: 20.0000 mg | ORAL_TABLET | Freq: Every day | ORAL | 3 refills | Status: DC

## 2019-03-05 NOTE — Telephone Encounter (Signed)
Patient aware of lab results. Will send in Lipitor 20 mg daily and f/u lipid and liver in 3 months on 06/07/19.

## 2019-03-05 NOTE — Telephone Encounter (Signed)
New Message  Pt is calling back for results   Please call  

## 2019-06-07 ENCOUNTER — Other Ambulatory Visit: Payer: Self-pay

## 2019-06-07 ENCOUNTER — Other Ambulatory Visit: Payer: 59 | Admitting: *Deleted

## 2019-06-07 DIAGNOSIS — E785 Hyperlipidemia, unspecified: Secondary | ICD-10-CM

## 2019-06-07 LAB — HEPATIC FUNCTION PANEL
ALT: 23 IU/L (ref 0–44)
AST: 25 IU/L (ref 0–40)
Albumin: 4.7 g/dL (ref 3.8–4.9)
Alkaline Phosphatase: 81 IU/L (ref 39–117)
Bilirubin Total: 0.5 mg/dL (ref 0.0–1.2)
Bilirubin, Direct: 0.19 mg/dL (ref 0.00–0.40)
Total Protein: 6.4 g/dL (ref 6.0–8.5)

## 2019-06-07 LAB — LIPID PANEL
Chol/HDL Ratio: 3.6 ratio (ref 0.0–5.0)
Cholesterol, Total: 151 mg/dL (ref 100–199)
HDL: 42 mg/dL (ref >39)
LDL Calculated: 84 mg/dL (ref 0–99)
Triglycerides: 126 mg/dL (ref 0–149)
VLDL Cholesterol Cal: 25 mg/dL (ref 5–40)

## 2020-01-23 ENCOUNTER — Other Ambulatory Visit: Payer: Self-pay | Admitting: Cardiovascular Disease

## 2020-01-28 ENCOUNTER — Other Ambulatory Visit: Payer: Self-pay | Admitting: Cardiovascular Disease

## 2020-01-30 NOTE — Progress Notes (Signed)
Telehealth Visit     Virtual Visit via Video Note   This visit type was conducted due to national recommendations for restrictions regarding the COVID-19 Pandemic (e.g. social distancing) in an effort to limit this patient's exposure and mitigate transmission in our community.  Due to his co-morbid illnesses, this patient is at least at moderate risk for complications without adequate follow up.  This format is felt to be most appropriate for this patient at this time.  All issues noted in this document were discussed and addressed.  A limited physical exam was performed with this format.  Please refer to the patient's chart for his consent to telehealth for [facility].   Evaluation Performed:  Follow-up visit  This visit type was conducted due to national recommendations for restrictions regarding the COVID-19 Pandemic (e.g. social distancing).  This format is felt to be most appropriate for this patient at this time.  All issues noted in this document were discussed and addressed.  No physical exam was performed (except for noted visual exam findings with Video Visits).  Please refer to the patient's chart (MyChart message for video visits and phone note for telephone visits) for the patient's consent to telehealth for [facility].  Date:  02/03/2020   ID:  [name], DOB November 28, 1960, MRN [id number]  Patient Location:  Home  Provider location:   Home  PCP:  Patient, No Pcp Per  Cardiologist:  Johnsie Cancel Electrophysiologist:  None   Chief Complaint:  Follow Up.   History of Present Illness:    Lucas Norton is a 59 y.o. male who presents via audio/video conferencing for a telehealth visit today.  Seen for Dr. Johnsie Cancel.   He has a history of STEMI in July of 2018 with DES to mLAD and LCX with POB of D2 - with EF 50 to 55%. Had recurrent chest pain with + enzymes in 2018 and had POB to D1 and re-intervention to LCX stent for proximal margin stenosis. Other issues include HTN, HLD  and smoking.   Last seen by telehealth visit with Dr. Johnsie Cancel in April - was about to run out of his medicines. Noted that his antina is typically severe back pain. Needed primary care doctor.   The patient does not have symptoms concerning for COVID-19 infection (fever, chills, cough, or new shortness of breath).   Seen today via [place] video. He has consented for this visit. He is doing ok. He has had COVID - he and his wife both had back in January. He really had no symptoms. No chest pain. His shortness of breath is stable - he feels this is from being out of shape. Still smoking - "not all the time". Still with no primary care. He ran out of his Imdur several weeks ago - asking if he really needs. BP quite high - says it is has been running like this as a general rule. Wanting to try some "Round 9" for exercise. Needs all his medicines refilled.    Past Medical History:  Diagnosis Date  . Back pain   . CAD in native artery    a. NSTEMI 04/2017 s/p DES to mLAD and Cx, PTCA to D2, EF 50-55%.  Marland Kitchen HLD (hyperlipidemia)   . Hypertension   . NSTEMI (non-ST elevated myocardial infarction) ([place]) 05/21/2017   05/21/17 PCI/DES to mLAD, mLcx. EF normal  . Sinus bradycardia    unable to titrate BB further due to this  . Tobacco abuse    Past Surgical History:  Procedure Laterality Date  . BACK SURGERY    . CORONARY BALLOON ANGIOPLASTY N/A 05/21/2017   Procedure: Coronary Balloon Angioplasty;  Surgeon: Jettie Booze, MD;  Location: [place] [facility];  Service: Cardiovascular;  Laterality: N/A;  Mid Diag  . CORONARY STENT INTERVENTION N/A 05/21/2017   Procedure: Coronary Stent Intervention;  Surgeon: Jettie Booze, MD;  Location: [place] [facility];  Service: Cardiovascular;  Laterality: N/A;  Mid LAD,Mid CFX  . CORONARY STENT INTERVENTION N/A 10/09/2017   Procedure: CORONARY STENT INTERVENTION;  Surgeon: Belva Crome, MD;  Location: [place] [facility];  Service: Cardiovascular;   Laterality: N/A;  . LEFT HEART CATH AND CORONARY ANGIOGRAPHY N/A 05/21/2017   Procedure: Left Heart Cath and Coronary Angiography;  Surgeon: Jettie Booze, MD;  Location: [place] [facility];  Service: Cardiovascular;  Laterality: N/A;  . LEFT HEART CATH AND CORONARY ANGIOGRAPHY N/A 10/09/2017   Procedure: LEFT HEART CATH AND CORONARY ANGIOGRAPHY;  Surgeon: Belva Crome, MD;  Location: [place] [facility];  Service: Cardiovascular;  Laterality: N/A;  . PARTIAL GASTRECTOMY    . ULTRASOUND GUIDANCE FOR VASCULAR ACCESS  10/09/2017   Procedure: Ultrasound Guidance For Vascular Access;  Surgeon: Belva Crome, MD;  Location: [place] [facility];  Service: Cardiovascular;;     Current Meds  Medication Sig  . ASPIRIN 81 PO Take 1 tablet by mouth daily.  Marland Kitchen atorvastatin (LIPITOR) 20 MG tablet Take 1 tablet (20 mg total) by mouth daily.  . clopidogrel (PLAVIX) 75 MG tablet TAKE 1 TABLET BY MOUTH EVERY DAY  . hydrochlorothiazide (HYDRODIURIL) 25 MG tablet One tablet daily  . nitroGLYCERIN (NITROSTAT) 0.4 MG SL tablet Place 1 tablet (0.4 mg total) under the tongue every 5 (five) minutes as needed for chest pain.  . [DISCONTINUED] hydrochlorothiazide (HYDRODIURIL) 25 MG tablet Take 1 tablet (25 mg total) by mouth daily. Please make overdue appt with Dr. Johnsie Cancel before anymore refills. 1st attempt     Allergies:   Brilinta [ticagrelor] and Lisinopril   Social History   Tobacco Use  . Smoking status: Current Some Day Smoker    Packs/day: 1.00    Years: 20.00    Pack years: 20.00    Types: Cigarettes  . Smokeless tobacco: Never Used  Substance Use Topics  . Alcohol use: Yes    Comment: socially  . Drug use: No     Family Hx: The patient's family history includes Hypertension in his father.  ROS:   Please see the history of present illness.   All other systems reviewed are negative.    Objective:    Vital Signs:  BP (!) 151/114   Pulse 79   Ht '5\' 10"'  (1.778 m)   Wt 185 lb (83.9  kg)   BMI 26.54 kg/m    Wt Readings from Last 3 Encounters:  02/03/20 185 lb (83.9 kg)  01/29/19 190 lb (86.2 kg)  01/22/18 197 lb (89.4 kg)    Alert male in no acute distress. Does not sound short of breath with conversation.    Labs/Other Tests and Data Reviewed:    Lab Results  Component Value Date   WBC 7.2 03/01/2019   HGB 15.9 03/01/2019   HCT 46.5 03/01/2019   PLT 199 03/01/2019   GLUCOSE 100 (H) 03/01/2019   CHOL 151 06/07/2019   TRIG 126 06/07/2019   HDL 42 06/07/2019   LDLCALC 84 06/07/2019   ALT 23 06/07/2019   AST 25 06/07/2019  NA 140 03/01/2019   K 4.5 03/01/2019   CL 101 03/01/2019   CREATININE 1.12 03/01/2019   BUN 11 03/01/2019   CO2 23 03/01/2019   TSH 1.516 10/09/2017   INR 1.08 10/09/2017   HGBA1C 5.3 03/01/2019     BNP (last 3 results) No results for input(s): BNP in the last 8760 hours.  ProBNP (last 3 results) No results for input(s): PROBNP in the last 8760 hours.    Prior CV studies:    The following studies were reviewed today:  CORONARY STENT INTERVENTION 09/2017  LEFT HEART CATH AND CORONARY ANGIOGRAPHY  Conclusion    Prox Cx-1 lesion is 20% stenosed.  Prox Cx-2 lesion is 80% stenosed.  Post intervention, there is a 0% residual stenosis.    Non-ST elevation MI presentation in this gentleman now 5-1/2 months post LAD and mid circumflex DES implantation.  Short left main  Widely patent LAD including the second diagonal which received POBA for diffuse high grade stenosis.  Both the LAD stent and diagonal angioplasty sites are widely patent.  The circumflex stent contains 85% proximal margin restenosis.  The vessel is otherwise patent with the exception of 20-30% focal stenoses in the proximal and mid vessel with ostial 30-40% narrowing.  Widely patent right coronary artery.  Successful stenting of the mid circumflex from 85% to 0% using a 12 x 3.5 mm Synergy postdilated to 3.75 mm in diameter at 15 atm.  The new  stent overlapped the proximal margin of the previously placed stent.  RECOMMENDATIONS:   Continue Plavix and aspirin  IV heparin discontinued  IV nitroglycerin discontinued  Aggressive risk factor modification  Eligible for discharge 10/10/17.    Echo Study Conclusions 09/2017  - Left ventricle: The cavity size was normal. Wall thickness was  normal. Systolic function was normal. The estimated ejection  fraction was in the range of 60% to 65%. Wall motion was normal;  there were no regional wall motion abnormalities. Doppler  parameters are consistent with abnormal left ventricular  relaxation (grade 1 diastolic dysfunction).  - Aortic valve: Trileaflet; mildly thickened, mildly calcified  leaflets.  - Mitral valve: There was mild regurgitation.    Coronary Balloon Angioplasty 04/2017  Coronary Stent Intervention  Left Heart Cath and Coronary Angiography  Conclusion    Mid LAD lesion, 90 %stenosed. A STENT SYNERGY DES 3.5X16 drug eluting stent was successfully placed, postdilated to 3.8 mm.  Mid Cx-2 lesion, 90 %stenosed. A STENT SYNERGY DES 3.5X20 drug eluting stent was successfully placed, postdilated to 3.8 mm in diameter.  Post intervention, there is a 0% residual stenosis.  2nd Diag lesion, 95 %stenosed. This was treated with balloon angioplasty with a 2.5 mm balloon.  Post intervention, there is a 20% residual stenosis.  The left ventricular ejection fraction is 50-55% by visual estimate.  The left ventricular systolic function is normal.  LV end diastolic pressure is normal.  There is no aortic valve stenosis.   Continue dual antiplatelet therapy for at least one year without interruption.  Patient with prior gastric surgery for bleeding ulcer so SYNERGY stents were placed in the event there are issues with antiplatelet therapy absorption, or bleeding.  He will need to stop smoking and will need high dose lipid lowering therapy.       ASSESSMENT & PLAN:     1. CAD - with prior multiple interventions - on indefinite DAPT - no active or worrisome symptoms - needs aggressive risk factor modification.   2. HTN -  BP not at goal - adding Norvasc 5 mg a day - sounds like he had a cough with ACE in the past - allergies updated - may need ARB as well. HCTZ refilled today.   3. HLD - on statin - needs labs.   4. Former smoker - not totally stopped.   5. COVID-19 Education: The signs and symptoms of COVID-19 were discussed with the patient and how to seek care for testing (follow up with PCP or arrange E-visit).  The importance of social distancing, staying at home, hand hygiene and wearing a mask when out in public were discussed today.  Patient Risk:   After full review of this patient's clinical status, I feel that they are at least moderate risk at this time.  Time:   Today, I have spent 15 minutes with the patient with telehealth technology discussing the above issues.     Medication Adjustments/Labs and Tests Ordered: Current medicines are reviewed at length with the patient today.  Concerns regarding medicines are outlined above.   Tests Ordered: No orders of the defined types were placed in this encounter.   Medication Changes: Meds ordered this encounter  Medications  . hydrochlorothiazide (HYDRODIURIL) 25 MG tablet    Sig: One tablet daily    Dispense:  90 tablet    Refill:  3    Disposition:  FU with [place] in about 3 weeks for in person office visit with fasting labs and EKG.     Patient is agreeable to this plan and will call if any problems develop in the interim.   Amie Critchley, NP  02/03/2020 3:27 PM    [facility]

## 2020-02-03 ENCOUNTER — Telehealth (INDEPENDENT_AMBULATORY_CARE_PROVIDER_SITE_OTHER): Payer: Self-pay | Admitting: Nurse Practitioner

## 2020-02-03 ENCOUNTER — Encounter: Payer: Self-pay | Admitting: Nurse Practitioner

## 2020-02-03 ENCOUNTER — Telehealth: Payer: Self-pay | Admitting: *Deleted

## 2020-02-03 ENCOUNTER — Other Ambulatory Visit: Payer: Self-pay

## 2020-02-03 VITALS — BP 151/114 | HR 79 | Ht 70.0 in | Wt 185.0 lb

## 2020-02-03 DIAGNOSIS — Z7189 Other specified counseling: Secondary | ICD-10-CM

## 2020-02-03 DIAGNOSIS — I2510 Atherosclerotic heart disease of native coronary artery without angina pectoris: Secondary | ICD-10-CM

## 2020-02-03 DIAGNOSIS — E785 Hyperlipidemia, unspecified: Secondary | ICD-10-CM

## 2020-02-03 DIAGNOSIS — I10 Essential (primary) hypertension: Secondary | ICD-10-CM

## 2020-02-03 MED ORDER — AMLODIPINE BESYLATE 5 MG PO TABS: 5.0000 mg | ORAL_TABLET | Freq: Every day | ORAL | 3 refills | Status: DC

## 2020-02-03 MED ORDER — NITROGLYCERIN 0.4 MG SL SUBL: 0.4000 mg | SUBLINGUAL_TABLET | SUBLINGUAL | 3 refills | Status: DC | PRN

## 2020-02-03 MED ORDER — HYDROCHLOROTHIAZIDE 25 MG PO TABS: ORAL_TABLET | ORAL | 3 refills | Status: DC

## 2020-02-03 MED ORDER — ATORVASTATIN CALCIUM 20 MG PO TABS: 20.0000 mg | ORAL_TABLET | Freq: Every day | ORAL | 3 refills | Status: DC

## 2020-02-03 MED ORDER — CLOPIDOGREL BISULFATE 75 MG PO TABS: 75.0000 mg | ORAL_TABLET | Freq: Every day | ORAL | 3 refills | Status: DC

## 2020-02-03 NOTE — Patient Instructions (Addendum)
After Visit Summary:  We will be checking the following labs today - NONE  Needs fasting labs at follow up visit.    Medication Instructions:    Continue with your current medicines. BUT  I have taken the Isosorbide off your list since you are not taking  I have refilled your other medicines  I am adding Norvasc 5 mg a day - this is for your blood pressure.     If you need a refill on your cardiac medications before your next appointment, please call your pharmacy.     Testing/Procedures To Be Arranged:  N/A  Follow-Up:   See [place] in about 3 weeks for in person office visit with lab/EKG    At [facility], you and your health needs are our priority.  As part of our continuing mission to provide you with exceptional heart care, we have created designated Provider Care Teams.  These Care Teams include your primary Cardiologist (physician) and Advanced Practice Providers (APPs -  Physician Assistants and Nurse Practitioners) who all work together to provide you with the care you need, when you need it.  Special Instructions:  . Stay safe, stay home, wash your hands for at least 20 seconds and wear a mask when out in public.  . It was good to talk with you today.  . Primary care options - see if your wife's doctor will see you. Can also consider [facility] [phone] - Dr. Francesco Sor is new and there is also another new partner that has joined them.    Call the [place] office at [phone] if you have any questions, problems or concerns.

## 2020-02-03 NOTE — Telephone Encounter (Signed)
  Patient Consent for Virtual Visit       Lucas Norton has provided verbal consent on 02/03/2020 for a virtual visit (video or telephone).   CONSENT FOR VIRTUAL VISIT FOR:  Lucas Norton  By participating in this virtual visit I agree to the following:  I hereby voluntarily request, consent and authorize [facility] and its employed or contracted physicians, physician assistants, nurse practitioners or other licensed health care professionals (the Practitioner), to provide me with telemedicine health care services (the "Services") as deemed necessary by the treating Practitioner. I acknowledge and consent to receive the Services by the Practitioner via telemedicine. I understand that the telemedicine visit will involve communicating with the Practitioner through live audiovisual communication technology and the disclosure of certain medical information by electronic transmission. I acknowledge that I have been given the opportunity to request an in-person assessment or other available alternative prior to the telemedicine visit and am voluntarily participating in the telemedicine visit.  I understand that I have the right to withhold or withdraw my consent to the use of telemedicine in the course of my care at any time, without affecting my right to future care or treatment, and that the Practitioner or I may terminate the telemedicine visit at any time. I understand that I have the right to inspect all information obtained and/or recorded in the course of the telemedicine visit and may receive copies of available information for a reasonable fee.  I understand that some of the potential risks of receiving the Services via telemedicine include:  Marland Kitchen Delay or interruption in medical evaluation due to technological equipment failure or disruption; . Information transmitted may not be sufficient (e.g. poor resolution of images) to allow for appropriate medical decision making by the Practitioner;  and/or  . In rare instances, security protocols could fail, causing a breach of personal health information.  Furthermore, I acknowledge that it is my responsibility to provide information about my medical history, conditions and care that is complete and accurate to the best of my ability. I acknowledge that Practitioner's advice, recommendations, and/or decision may be based on factors not within their control, such as incomplete or inaccurate data provided by me or distortions of diagnostic images or specimens that may result from electronic transmissions. I understand that the practice of medicine is not an exact science and that Practitioner makes no warranties or guarantees regarding treatment outcomes. I acknowledge that a copy of this consent can be made available to me via my patient portal ([place]), or I can request a printed copy by calling the office of [facility].    I understand that my insurance will be billed for this visit.   I have read or had this consent read to me. . I understand the contents of this consent, which adequately explains the benefits and risks of the Services being provided via telemedicine.  . I have been provided ample opportunity to ask questions regarding this consent and the Services and have had my questions answered to my satisfaction. . I give my informed consent for the services to be provided through the use of telemedicine in my medical care

## 2020-02-26 NOTE — Progress Notes (Signed)
CARDIOLOGY OFFICE NOTE  Date:  03/04/2020    Lucas Norton Date of Birth: 1961/02/16 Medical Record #[id number]  PCP:  Patient, No Pcp Per  Cardiologist:  Gillian Shields   Chief Complaint  Patient presents with  . Follow-up    Seen for Dr. Johnsie Cancel    History of Present Illness: Lucas Norton is a 59 y.o. male who presents today for a one month check. Seen for Dr. Johnsie Cancel.   He has a history of STEMI in July of 2018 with DES to mLAD and LCX with POB of D2 - with EF 50 to 55%. Had recurrent chest pain with + enzymes in 2018 and had POB to D1 and re-intervention to LCX stent for proximal margin stenosis. Other issues include HTN, HLD and smoking.   Last seen by telehealth visit with Dr. Johnsie Cancel in April 2020 - was about to run out of his medicines. Noted that his angina is typically severe back pain. Needed primary care doctor.   I did a virtual visit with him last month - had had COVID back in January. Still smoking. No PCP. BP quite high. Had ran out of his Imdur. I added Norvasc and asked him to come for an in office visit with EKG and BP follow up.   The patient does not have symptoms concerning for COVID-19 infection (fever, chills, cough, or new shortness of breath).   Comes in today. Here alone. He feels like he is doing well. Getting back to exercise. Smoking an occasional cigarette. No chest pain - no back pain which is what his typical angina is. Needs labs. Not really checking BP with the addition of Norvasc but BP is good here today. Not short of breath. Some bruising - mostly contained to his arms - remains on indefinite DAPT. Overall, he feels like he is doing well.   Past Medical History:  Diagnosis Date  . Back pain   . CAD in native artery    a. NSTEMI 04/2017 s/p DES to mLAD and Cx, PTCA to D2, EF 50-55%.  Marland Kitchen HLD (hyperlipidemia)   . Hypertension   . NSTEMI (non-ST elevated myocardial infarction) ([place]) 05/21/2017   05/21/17 PCI/DES to mLAD, mLcx. EF  normal  . Sinus bradycardia    unable to titrate BB further due to this  . Tobacco abuse     Past Surgical History:  Procedure Laterality Date  . BACK SURGERY    . CORONARY BALLOON ANGIOPLASTY N/A 05/21/2017   Procedure: Coronary Balloon Angioplasty;  Surgeon: Jettie Booze, MD;  Location: [facility];  Service: Cardiovascular;  Laterality: N/A;  Mid Diag  . CORONARY STENT INTERVENTION N/A 05/21/2017   Procedure: Coronary Stent Intervention;  Surgeon: Jettie Booze, MD;  Location: [facility];  Service: Cardiovascular;  Laterality: N/A;  Mid LAD,Mid CFX  . CORONARY STENT INTERVENTION N/A 10/09/2017   Procedure: CORONARY STENT INTERVENTION;  Surgeon: Belva Crome, MD;  Location: [facility];  Service: Cardiovascular;  Laterality: N/A;  . LEFT HEART CATH AND CORONARY ANGIOGRAPHY N/A 05/21/2017   Procedure: Left Heart Cath and Coronary Angiography;  Surgeon: Jettie Booze, MD;  Location: [facility];  Service: Cardiovascular;  Laterality: N/A;  . LEFT HEART CATH AND CORONARY ANGIOGRAPHY N/A 10/09/2017   Procedure: LEFT HEART CATH AND CORONARY ANGIOGRAPHY;  Surgeon: Belva Crome, MD;  Location: [facility];  Service: Cardiovascular;  Laterality: N/A;  . PARTIAL GASTRECTOMY    .  ULTRASOUND GUIDANCE FOR VASCULAR ACCESS  10/09/2017   Procedure: Ultrasound Guidance For Vascular Access;  Surgeon: Belva Crome, MD;  Location: [facility];  Service: Cardiovascular;;     Medications: Current Meds  Medication Sig  . amLODipine (NORVASC) 5 MG tablet Take 1 tablet (5 mg total) by mouth daily.  . ASPIRIN 81 PO Take 1 tablet by mouth daily.  Marland Kitchen atorvastatin (LIPITOR) 20 MG tablet Take 1 tablet (20 mg total) by mouth daily.  . clopidogrel (PLAVIX) 75 MG tablet Take 1 tablet (75 mg total) by mouth daily.  . hydrochlorothiazide (HYDRODIURIL) 25 MG tablet One tablet daily  . nitroGLYCERIN (NITROSTAT) 0.4 MG SL tablet Place 1 tablet (0.4 mg  total) under the tongue every 5 (five) minutes as needed for chest pain.     Allergies: Allergies  Allergen Reactions  . Brilinta [Ticagrelor] Shortness Of Breath  . Lisinopril Cough    Social History: The patient  reports that he has been smoking cigarettes. He has a 20.00 pack-year smoking history. He has never used smokeless tobacco. He reports current alcohol use. He reports that he does not use drugs.   Family History: The patient's family history includes Hypertension in his father.   Review of Systems: Please see the history of present illness.   All other systems are reviewed and negative.   Physical Exam: VS:  BP 134/88   Pulse (!) 57   Ht '5\' 10"'  (1.778 m)   Wt 194 lb 12.8 oz (88.4 kg)   BMI 27.95 kg/m  .  BMI Body mass index is 27.95 kg/m.  Wt Readings from Last 3 Encounters:  03/04/20 194 lb 12.8 oz (88.4 kg)  02/03/20 185 lb (83.9 kg)  01/29/19 190 lb (86.2 kg)    General: Pleasant. Alert and in no acute distress.   HEENT: Normal.  Neck: Supple - he does have bilateral bruits noted on exam.  Cardiac: Regular rate and rhythm. No murmurs, rubs, or gallops. No edema.  Respiratory:  Lungs are clear to auscultation bilaterally with normal work of breathing.  GI: Soft and nontender.  MS: No deformity or atrophy. Gait and ROM intact.  Skin: Warm and dry. Color is normal.  Neuro:  Strength and sensation are intact and no gross focal deficits noted.  Psych: Alert, appropriate and with normal affect.   LABORATORY DATA:  EKG:  EKG is ordered today.  Personally reviewed by me. This demonstrates sinus bradycardia - HR is 57 - no acute changes.  Lab Results  Component Value Date   WBC 7.2 03/01/2019   HGB 15.9 03/01/2019   HCT 46.5 03/01/2019   PLT 199 03/01/2019   GLUCOSE 100 (H) 03/01/2019   CHOL 151 06/07/2019   TRIG 126 06/07/2019   HDL 42 06/07/2019   LDLCALC 84 06/07/2019   ALT 23 06/07/2019   AST 25 06/07/2019   NA 140 03/01/2019   K 4.5  03/01/2019   CL 101 03/01/2019   CREATININE 1.12 03/01/2019   BUN 11 03/01/2019   CO2 23 03/01/2019   TSH 1.516 10/09/2017   INR 1.08 10/09/2017   HGBA1C 5.3 03/01/2019     BNP (last 3 results) No results for input(s): BNP in the last 8760 hours.  ProBNP (last 3 results) No results for input(s): PROBNP in the last 8760 hours.   Other Studies Reviewed Today:  CORONARY STENT INTERVENTION 09/2017  LEFT HEART CATH AND CORONARY ANGIOGRAPHY  Conclusion    Prox Cx-1 lesion is 20% stenosed.  Prox Cx-2 lesion is 80% stenosed.  Post intervention, there is a 0% residual stenosis.   Non-ST elevation MI presentation in this gentleman now 5-1/2 months post LAD and mid circumflex DES implantation.  Short left main  Widely patent LAD including the second diagonal which received POBA for diffuse high grade stenosis. Both the LAD stent and diagonal angioplasty sites are widely patent.  The circumflex stent contains 85% proximal margin restenosis. The vessel is otherwise patent with the exception of 20-30% focal stenoses in the proximal and mid vessel with ostial 30-40% narrowing.  Widely patent right coronary artery.  Successful stenting of the mid circumflex from 85% to 0% using a 12 x 3.5 mm Synergy postdilated to 3.75 mm in diameter at 15 atm. The new stent overlapped the proximal margin of the previously placed stent.  RECOMMENDATIONS:   Continue Plavix and aspirin  IV heparin discontinued  IV nitroglycerin discontinued  Aggressive risk factor modification  Eligible for discharge 10/10/17.    Echo Study Conclusions 09/2017  - Left ventricle: The cavity size was normal. Wall thickness was  normal. Systolic function was normal. The estimated ejection  fraction was in the range of 60% to 65%. Wall motion was normal;  there were no regional wall motion abnormalities. Doppler  parameters are consistent with abnormal left ventricular  relaxation  (grade 1 diastolic dysfunction).  - Aortic valve: Trileaflet; mildly thickened, mildly calcified  leaflets.  - Mitral valve: There was mild regurgitation.    Coronary Balloon Angioplasty 04/2017  Coronary Stent Intervention  Left Heart Cath and Coronary Angiography  Conclusion    Mid LAD lesion, 90 %stenosed. A STENT SYNERGY DES 3.5X16 drug eluting stent was successfully placed, postdilated to 3.8 mm.  Mid Cx-2 lesion, 90 %stenosed. A STENT SYNERGY DES 3.5X20 drug eluting stent was successfully placed, postdilated to 3.8 mm in diameter.  Post intervention, there is a 0% residual stenosis.  2nd Diag lesion, 95 %stenosed. This was treated with balloon angioplasty with a 2.5 mm balloon.  Post intervention, there is a 20% residual stenosis.  The left ventricular ejection fraction is 50-55% by visual estimate.  The left ventricular systolic function is normal.  LV end diastolic pressure is normal.  There is no aortic valve stenosis.  Continue dual antiplatelet therapy for at least one year without interruption. Patient with prior gastric surgery for bleeding ulcer so SYNERGY stents were placed in the event there are issues with antiplatelet therapy absorption, or bleeding.  He will need to stop smoking and will need high dose lipid lowering therapy.      ASSESSMENT & PLAN:    1. CAD - has had prior multiple interventions - he is on indefinite DAPT - no active or worrisome symptoms noted. EKG is ok. Needs aggressive CV risk factor modification.   2. HTN - BP much better - continue with Norvasc and HCTZ.   3. Bruising - most likely on DAPT - will check CBC today.   4. HLD - on Lipitor - needs labs today. He is fasting.   5. Former smoker - not totally stopped - total abstinence encouraged.   6. Carotid bruit - noted on exam bilaterally - will arrange for duplex study.   7. COVID-19 Education: The signs and symptoms of COVID-19 were discussed with the patient  and how to seek care for testing (follow up with PCP or arrange E-visit).  The importance of social distancing, staying at home, hand hygiene and wearing a mask when out in public  were discussed today.  Current medicines are reviewed with the patient today.  The patient does not have concerns regarding medicines other than what has been noted above.  The following changes have been made:  See above.  Labs/ tests ordered today include:    Orders Placed This Encounter  Procedures  . Basic metabolic panel  . CBC  . Hepatic function panel  . Lipid panel  . EKG 12-Lead  . VAS US CAROTID     Disposition:   FU with [place] in 6 months. Lab today. Carotid doppler study to be arranged as well.    Patient is agreeable to this plan and will call if any problems develop in the interim.   SignedTruitt Merle, NP  03/04/2020 9:46 AM  [facility] [address] [place], [place]  [address] Phone: [phone] Fax: [phone]

## 2020-03-04 ENCOUNTER — Ambulatory Visit (INDEPENDENT_AMBULATORY_CARE_PROVIDER_SITE_OTHER): Payer: Commercial Managed Care - PPO | Admitting: Nurse Practitioner

## 2020-03-04 ENCOUNTER — Other Ambulatory Visit: Payer: Self-pay

## 2020-03-04 ENCOUNTER — Encounter: Payer: Self-pay | Admitting: Nurse Practitioner

## 2020-03-04 ENCOUNTER — Ambulatory Visit (HOSPITAL_COMMUNITY)
Admission: RE | Admit: 2020-03-04 | Discharge: 2020-03-04 | Disposition: A | Discharge status: Home / Self Care | Payer: Commercial Managed Care - PPO | Source: Ambulatory Visit | Attending: Cardiovascular Disease | Admitting: Cardiovascular Disease

## 2020-03-04 VITALS — BP 134/88 | HR 57 | Ht 70.0 in | Wt 194.8 lb

## 2020-03-04 DIAGNOSIS — I2510 Atherosclerotic heart disease of native coronary artery without angina pectoris: Secondary | ICD-10-CM | POA: Diagnosis not present

## 2020-03-04 DIAGNOSIS — I10 Essential (primary) hypertension: Secondary | ICD-10-CM

## 2020-03-04 DIAGNOSIS — E785 Hyperlipidemia, unspecified: Secondary | ICD-10-CM

## 2020-03-04 DIAGNOSIS — Z7189 Other specified counseling: Secondary | ICD-10-CM

## 2020-03-04 NOTE — Patient Instructions (Addendum)
After Visit Summary:  We will be checking the following labs today - BMET, CBC, Lipids and HPF   Medication Instructions:    Continue with your current medicines.    If you need a refill on your cardiac medications before your next appointment, please call your pharmacy.     Testing/Procedures To Be Arranged:  Carotid doppler study  Follow-Up:   See me or Dr. Johnsie Cancel in 6 months    At [facility], you and your health needs are our priority.  As part of our continuing mission to provide you with exceptional heart care, we have created designated Provider Care Teams.  These Care Teams include your primary Cardiologist (physician) and Advanced Practice Providers (APPs -  Physician Assistants and Nurse Practitioners) who all work together to provide you with the care you need, when you need it.  Special Instructions:  . Stay safe, stay home, wash your hands for at least 20 seconds and wear a mask when out in public.  . It was good to talk with you today.    Call the [place] office at [phone] if you have any questions, problems or concerns.

## 2020-03-05 ENCOUNTER — Telehealth: Payer: Self-pay | Admitting: Nurse Practitioner

## 2020-03-05 LAB — BASIC METABOLIC PANEL
BUN/Creatinine Ratio: 11 (ref 9–20)
BUN: 11 mg/dL (ref 6–24)
CO2: 27 mmol/L (ref 20–29)
Calcium: 9.9 mg/dL (ref 8.7–10.2)
Chloride: 102 mmol/L (ref 96–106)
Creatinine, Ser: 0.98 mg/dL (ref 0.76–1.27)
GFR calc Af Amer: 98 mL/min/{1.73_m2} (ref >59)
GFR calc non Af Amer: 85 mL/min/{1.73_m2} (ref >59)
Glucose: 98 mg/dL (ref 65–99)
Potassium: 4.1 mmol/L (ref 3.5–5.2)
Sodium: 141 mmol/L (ref 134–144)

## 2020-03-05 LAB — LIPID PANEL
Chol/HDL Ratio: 3.5 ratio (ref 0.0–5.0)
Cholesterol, Total: 146 mg/dL (ref 100–199)
HDL: 42 mg/dL (ref >39)
LDL Chol Calc (NIH): 88 mg/dL (ref 0–99)
Triglycerides: 86 mg/dL (ref 0–149)
VLDL Cholesterol Cal: 16 mg/dL (ref 5–40)

## 2020-03-05 LAB — HEPATIC FUNCTION PANEL
ALT: 25 IU/L (ref 0–44)
AST: 25 IU/L (ref 0–40)
Albumin: 4.6 g/dL (ref 3.8–4.9)
Alkaline Phosphatase: 77 IU/L (ref 39–117)
Bilirubin Total: 0.5 mg/dL (ref 0.0–1.2)
Bilirubin, Direct: 0.17 mg/dL (ref 0.00–0.40)
Total Protein: 6.5 g/dL (ref 6.0–8.5)

## 2020-03-05 LAB — CBC
Hematocrit: 48.3 % (ref 37.5–51.0)
Hemoglobin: 16.3 g/dL (ref 13.0–17.7)
MCH: 31.8 pg (ref 26.6–33.0)
MCHC: 33.7 g/dL (ref 31.5–35.7)
MCV: 94 fL (ref 79–97)
Platelets: 198 10*3/uL (ref 150–450)
RBC: 5.13 x10E6/uL (ref 4.14–5.80)
RDW: 12.7 % (ref 11.6–15.4)
WBC: 7.7 10*3/uL (ref 3.4–10.8)

## 2020-03-05 NOTE — Telephone Encounter (Signed)
New message ° ° °Patient is returning call for lab results. Please call. °

## 2020-03-06 ENCOUNTER — Telehealth: Payer: Self-pay

## 2020-03-06 NOTE — Telephone Encounter (Signed)
The patient has been notified of the [place] result and verbalized understanding.  All questions (if any) were answered. Frederik Schmidt, RN 03/06/2020 2:39 PM

## 2020-03-06 NOTE — Telephone Encounter (Signed)
-----   Message from Burtis Junes, NP sent at 03/06/2020 10:13 AM EDT ----- Ok to report. 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 Johnsie Cancel as well. We could consider referral to [facility] if he would like but the subclavian velocities were < 31m/sec and there is nothing to do about vertebral occlusion. He needs to stop all tobacco.

## 2020-03-06 NOTE — Telephone Encounter (Signed)
lpmtcb 5/14

## 2020-03-06 NOTE — Telephone Encounter (Signed)
-----   Message from Burtis Junes, NP sent at 03/06/2020 10:13 AM EDT ----- Ok to report. 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 Johnsie Cancel as well. We could consider referral to [facility] if he would like but the subclavian velocities were < 51m/sec and there is nothing to do about vertebral occlusion. He needs to stop all tobacco.

## 2020-03-09 ENCOUNTER — Other Ambulatory Visit: Payer: Self-pay | Admitting: *Deleted

## 2020-03-09 DIAGNOSIS — E785 Hyperlipidemia, unspecified: Secondary | ICD-10-CM

## 2020-03-09 MED ORDER — EZETIMIBE 10 MG PO TABS
10.0000 mg | ORAL_TABLET | Freq: Every day | ORAL | 3 refills | Status: DC
Start: 2020-03-09 — End: 2021-01-19

## 2020-06-05 ENCOUNTER — Other Ambulatory Visit: Payer: Commercial Managed Care - PPO

## 2020-08-24 NOTE — Progress Notes (Deleted)
CARDIOLOGY OFFICE NOTE  Date:  08/24/2020    Lucas Norton Date of Birth: [birth year] Medical Record #[id number]  PCP:  Patient, No Pcp Per  Cardiologist:  Servando Snare & Nishan    No chief complaint on file.   History of Present Illness: Lucas Norton is a 59 y.o. male who presents today for a follow up visit. Seen for Dr. Johnsie Cancel.   He has a history of STEMI in July of 2018 with DES to mLAD and LCX with POB of D2 - with EF 50 to 55%. Had recurrent chest pain with + enzymes in 2018 and had POB to D1 and re-intervention to LCX stent for proximal margin stenosis. Other issues include HTN, HLD and smoking.   Last seen by telehealth visit with Dr. Johnsie Cancel in April 2020 - was about to run out of his medicines. Noted that his angina is typically severe back pain. Needed primary care doctor.  I did a virtual visit with him in April - he had had COVID back in January. Still smoking. No PCP. BP quite high. Had ran out of his Imdur. I added Norvasc and asked him to come for an in office visit with EKG and BP follow up. On follow up in May - he was doing ok - BP improved here. Remains on indefinite DAPT.   Comes in today. Here with   Past Medical History:  Diagnosis Date  . Back pain   . CAD in native artery    a. NSTEMI 04/2017 s/p DES to mLAD and Cx, PTCA to D2, EF 50-55%.  Marland Kitchen HLD (hyperlipidemia)   . Hypertension   . NSTEMI (non-ST elevated myocardial infarction) ([facility]) 05/21/2017   05/21/17 PCI/DES to mLAD, mLcx. EF normal  . Sinus bradycardia    unable to titrate BB further due to this  . Tobacco abuse     Past Surgical History:  Procedure Laterality Date  . BACK SURGERY    . CORONARY BALLOON ANGIOPLASTY N/A 05/21/2017   Procedure: Coronary Balloon Angioplasty;  Surgeon: Jettie Booze, MD;  Location: [facility];  Service: Cardiovascular;  Laterality: N/A;  Mid Diag  . CORONARY STENT INTERVENTION N/A 05/21/2017   Procedure: Coronary Stent Intervention;  Surgeon:  Jettie Booze, MD;  Location: [facility];  Service: Cardiovascular;  Laterality: N/A;  Mid LAD,Mid CFX  . CORONARY STENT INTERVENTION N/A 10/09/2017   Procedure: CORONARY STENT INTERVENTION;  Surgeon: Belva Crome, MD;  Location: [facility];  Service: Cardiovascular;  Laterality: N/A;  . LEFT HEART CATH AND CORONARY ANGIOGRAPHY N/A 05/21/2017   Procedure: Left Heart Cath and Coronary Angiography;  Surgeon: Jettie Booze, MD;  Location: [facility];  Service: Cardiovascular;  Laterality: N/A;  . LEFT HEART CATH AND CORONARY ANGIOGRAPHY N/A 10/09/2017   Procedure: LEFT HEART CATH AND CORONARY ANGIOGRAPHY;  Surgeon: Belva Crome, MD;  Location: [facility];  Service: Cardiovascular;  Laterality: N/A;  . PARTIAL GASTRECTOMY    . ULTRASOUND GUIDANCE FOR VASCULAR ACCESS  10/09/2017   Procedure: Ultrasound Guidance For Vascular Access;  Surgeon: Belva Crome, MD;  Location: [facility];  Service: Cardiovascular;;     Medications: No outpatient medications have been marked as taking for the 09/07/20 encounter (Appointment) with Burtis Junes, NP.     Allergies: Allergies  Allergen Reactions  . Brilinta [Ticagrelor] Shortness Of Breath  . Lisinopril Cough    Social History: The patient  reports  that he has been smoking cigarettes. He has a 20.00 pack-year smoking history. He has never used smokeless tobacco. He reports current alcohol use. He reports that he does not use drugs.   Family History: The patient's ***family history includes Hypertension in his father.   Review of Systems: Please see the history of present illness.   All other systems are reviewed and negative.   Physical Exam: VS:  There were no vitals taken for this visit. Marland Kitchen  BMI There is no height or weight on file to calculate BMI.  Wt Readings from Last 3 Encounters:  03/04/20 194 lb 12.8 oz (88.4 kg)  02/03/20 185 lb (83.9 kg)  01/29/19 190 lb (86.2 kg)     General: Pleasant. Well developed, well nourished and in no acute distress.   HEENT: Normal.  Neck: Supple, no JVD, carotid bruits, or masses noted.  Cardiac: ***Regular rate and rhythm. No murmurs, rubs, or gallops. No edema.  Respiratory:  Lungs are clear to auscultation bilaterally with normal work of breathing.  GI: Soft and nontender.  MS: No deformity or atrophy. Gait and ROM intact.  Skin: Warm and dry. Color is normal.  Neuro:  Strength and sensation are intact and no gross focal deficits noted.  Psych: Alert, appropriate and with normal affect.   LABORATORY DATA:  EKG:  EKG {ACTION; IS/IS ZOX:[id number]} ordered today.  Personally reviewed by me. This demonstrates ***.  Lab Results  Component Value Date   WBC 7.7 03/04/2020   HGB 16.3 03/04/2020   HCT 48.3 03/04/2020   PLT 198 03/04/2020   GLUCOSE 98 03/04/2020   CHOL 146 03/04/2020   TRIG 86 03/04/2020   HDL 42 03/04/2020   LDLCALC 88 03/04/2020   ALT 25 03/04/2020   AST 25 03/04/2020   NA 141 03/04/2020   K 4.1 03/04/2020   CL 102 03/04/2020   CREATININE 0.98 03/04/2020   BUN 11 03/04/2020   CO2 27 03/04/2020   TSH 1.516 10/09/2017   INR 1.08 10/09/2017   HGBA1C 5.3 03/01/2019     BNP (last 3 results) No results for input(s): BNP in the last 8760 hours.  ProBNP (last 3 results) No results for input(s): PROBNP in the last 8760 hours.   Other Studies Reviewed Today:  CORONARY STENT INTERVENTION12/2018  LEFT HEART CATH AND CORONARY ANGIOGRAPHY  Conclusion    Prox Cx-1 lesion is 20% stenosed.  Prox Cx-2 lesion is 80% stenosed.  Post intervention, there is a 0% residual stenosis.   Non-ST elevation MI presentation in this gentleman now 5-1/2 months post LAD and mid circumflex DES implantation.  Short left main  Widely patent LAD including the second diagonal which received POBA for diffuse high grade stenosis. Both the LAD stent and diagonal angioplasty sites are widely patent.  The  circumflex stent contains 85% proximal margin restenosis. The vessel is otherwise patent with the exception of 20-30% focal stenoses in the proximal and mid vessel with ostial 30-40% narrowing.  Widely patent right coronary artery.  Successful stenting of the mid circumflex from 85% to 0% using a 12 x 3.5 mm Synergy postdilated to 3.75 mm in diameter at 15 atm. The new stent overlapped the proximal margin of the previously placed stent.  RECOMMENDATIONS:   Continue Plavix and aspirin  IV heparin discontinued  IV nitroglycerin discontinued  Aggressive risk factor modification  Eligible for discharge 10/10/17.    EchoStudy Conclusions12/2018  - Left ventricle: The cavity size was normal. Wall thickness was  normal. Systolic  function was normal. The estimated ejection  fraction was in the range of 60% to 65%. Wall motion was normal;  there were no regional wall motion abnormalities. Doppler  parameters are consistent with abnormal left ventricular  relaxation (grade 1 diastolic dysfunction).  - Aortic valve: Trileaflet; mildly thickened, mildly calcified  leaflets.  - Mitral valve: There was mild regurgitation.    Coronary Balloon Angioplasty7/2018  Coronary Stent Intervention  Left Heart Cath and Coronary Angiography  Conclusion    Mid LAD lesion, 90 %stenosed. A STENT SYNERGY DES 3.5X16 drug eluting stent was successfully placed, postdilated to 3.8 mm.  Mid Cx-2 lesion, 90 %stenosed. A STENT SYNERGY DES 3.5X20 drug eluting stent was successfully placed, postdilated to 3.8 mm in diameter.  Post intervention, there is a 0% residual stenosis.  2nd Diag lesion, 95 %stenosed. This was treated with balloon angioplasty with a 2.5 mm balloon.  Post intervention, there is a 20% residual stenosis.  The left ventricular ejection fraction is 50-55% by visual estimate.  The left ventricular systolic function is normal.  LV end diastolic pressure is  normal.  There is no aortic valve stenosis.  Continue dual antiplatelet therapy for at least one year without interruption. Patient with prior gastric surgery for bleeding ulcer so SYNERGY stents were placed in the event there are issues with antiplatelet therapy absorption, or bleeding.  He will need to stop smoking and will need high dose lipid lowering therapy.      ASSESSMENT & PLAN:   1. CAD - has had prior multiple interventions - he is on indefinite DAPT - no active or worrisome symptoms noted. EKG is ok. Needs aggressive CV risk factor modification.   2. HTN - BP much better - continue with Norvasc and HCTZ.   3. Bruising - most likely on DAPT - will check CBC today.   4. HLD - on Lipitor - needs labs today. He is fasting.   5. Former smoker - not totally stopped - total abstinence encouraged.   6. Carotid bruit - noted on exam bilaterally - will arrange for duplex study.    Current medicines are reviewed with the patient today.  The patient does not have concerns regarding medicines other than what has been noted above.  The following changes have been made:  See above.  Labs/ tests ordered today include:   No orders of the defined types were placed in this encounter.    Disposition:   FU with *** in {gen number 8-25:003704} {Days to years:10300}.   Patient is agreeable to this plan and will call if any problems develop in the interim.   SignedTruitt Merle, NP  08/24/2020 11:04 AM  [facility] [address] [place], [place]  [address] Phone: [phone] Fax: [phone]

## 2020-09-01 ENCOUNTER — Ambulatory Visit (INDEPENDENT_AMBULATORY_CARE_PROVIDER_SITE_OTHER): Payer: Commercial Managed Care - PPO | Admitting: Internal Medicine

## 2020-09-01 ENCOUNTER — Encounter: Payer: Self-pay | Admitting: Internal Medicine

## 2020-09-01 ENCOUNTER — Other Ambulatory Visit: Payer: Self-pay

## 2020-09-01 VITALS — BP 140/88 | HR 80 | Temp 98.9°F | Ht 70.0 in | Wt 195.5 lb

## 2020-09-01 DIAGNOSIS — Z125 Encounter for screening for malignant neoplasm of prostate: Secondary | ICD-10-CM

## 2020-09-01 DIAGNOSIS — I6523 Occlusion and stenosis of bilateral carotid arteries: Secondary | ICD-10-CM

## 2020-09-01 DIAGNOSIS — I10 Essential (primary) hypertension: Secondary | ICD-10-CM

## 2020-09-01 DIAGNOSIS — Z23 Encounter for immunization: Secondary | ICD-10-CM | POA: Diagnosis not present

## 2020-09-01 DIAGNOSIS — E785 Hyperlipidemia, unspecified: Secondary | ICD-10-CM

## 2020-09-01 DIAGNOSIS — F17210 Nicotine dependence, cigarettes, uncomplicated: Secondary | ICD-10-CM

## 2020-09-01 DIAGNOSIS — N529 Male erectile dysfunction, unspecified: Secondary | ICD-10-CM

## 2020-09-01 DIAGNOSIS — Z1211 Encounter for screening for malignant neoplasm of colon: Secondary | ICD-10-CM | POA: Diagnosis not present

## 2020-09-01 DIAGNOSIS — K921 Melena: Secondary | ICD-10-CM

## 2020-09-01 DIAGNOSIS — I2510 Atherosclerotic heart disease of native coronary artery without angina pectoris: Secondary | ICD-10-CM | POA: Diagnosis not present

## 2020-09-01 LAB — CBC WITH DIFFERENTIAL/PLATELET
Absolute Monocytes: 568 cells/uL (ref 200–950)
Basophils Absolute: 48 cells/uL (ref 0–200)
Basophils Relative: 0.6 %
Eosinophils Absolute: 80 cells/uL (ref 15–500)
Eosinophils Relative: 1 %
HCT: 47.7 % (ref 38.5–50.0)
Hemoglobin: 15.6 g/dL (ref 13.2–17.1)
Lymphs Abs: 1880 cells/uL (ref 850–3900)
MCH: 30.4 pg (ref 27.0–33.0)
MCHC: 32.7 g/dL (ref 32.0–36.0)
MCV: 92.8 fL (ref 80.0–100.0)
MPV: 11.5 fL (ref 7.5–12.5)
Monocytes Relative: 7.1 %
Neutro Abs: 5424 cells/uL (ref 1500–7800)
Neutrophils Relative %: 67.8 %
Platelets: 189 10*3/uL (ref 140–400)
RBC: 5.14 10*6/uL (ref 4.20–5.80)
RDW: 12.3 % (ref 11.0–15.0)
Total Lymphocyte: 23.5 %
WBC: 8 10*3/uL (ref 3.8–10.8)

## 2020-09-01 MED ORDER — SILDENAFIL CITRATE 50 MG PO TABS: 50.0000 mg | ORAL_TABLET | Freq: Every day | ORAL | 0 refills | Status: AC | PRN

## 2020-09-01 MED ORDER — AMLODIPINE BESYLATE 10 MG PO TABS: 10.0000 mg | ORAL_TABLET | Freq: Every day | ORAL | 1 refills | Status: DC

## 2020-09-01 NOTE — Patient Instructions (Signed)
-  Nice seeing you today!!  -Lab work today; will notify you once results are available.  -Flu and pneumonia vaccines today.  -GI referral for colonoscopy today.  -Increase amlodipine to 10 mg daily.  -May use sildenafil 50 mg as needed.  -Schedule follow up in 8 weeks. Please come in fasting that day.

## 2020-09-01 NOTE — Addendum Note (Signed)
Addended by: Westley Hummer B on: 09/01/2020 05:36 PM   Modules accepted: Orders

## 2020-09-01 NOTE — Progress Notes (Signed)
New Patient Office Visit     This visit occurred during the SARS-CoV-2 public health emergency.  Safety protocols were in place, including screening questions prior to the visit, additional usage of staff PPE, and extensive cleaning of exam room while observing appropriate contact time as indicated for disinfecting solutions.    CC/Reason for Visit: Establish care, discuss chronic medical conditions as well as some acute concerns Previous PCP: None Last Visit: Unknown  HPI: Eian Vandervelden is a 59 y.o. male who is coming in today for the above mentioned reasons.  He moved to [place] from [place] in 2018.  About a month after moving he had a STEMI.  He has been followed closely by cardiology, but has not yet established primary care in [place].  He also has a history of hyperlipidemia, hypertension, he has a bilateral carotid bruit with a carotid Doppler in May 2021 that showed bilateral 1 to 39% stenosis.  He has ongoing tobacco abuse and has smoked a half a pack to a pack a day for over 30 years.  Drinks alcohol on the weekends.  He has allergies to ACE inhibitors.  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  He is not currently on PPI therapy.  He is an Chief Financial Officer, he is married, lives here with his wife, he has 5 children, none of them are local.  He is overdue for flu, tetanus, Pneumovax, shingles vaccines.  He completed his Covid vaccination series in July.  He had COVID-19 virus infection in January 2021 but recovered well from it.  He has been having issues with high blood pressure.  He states his blood pressure in the office today is "lower than it has been in a while".  It is 140/98.  For the past year he has noted intermittently bright blood blood per rectum, sometimes with a small mass extruding from the anus.  At times it bleeds quite profusely.  He is overdue for screening colonoscopy.   Past Medical/Surgical History: Past Medical History:  Diagnosis Date  . Back  pain   . CAD in native artery    a. NSTEMI 04/2017 s/p DES to mLAD and Cx, PTCA to D2, EF 50-55%.  Marland Kitchen HLD (hyperlipidemia)   . Hypertension   . NSTEMI (non-ST elevated myocardial infarction) ([place]) 05/21/2017   05/21/17 PCI/DES to mLAD, mLcx. EF normal  . Sinus bradycardia    unable to titrate BB further due to this  . Tobacco abuse     Past Surgical History:  Procedure Laterality Date  . BACK SURGERY    . CORONARY BALLOON ANGIOPLASTY N/A 05/21/2017   Procedure: Coronary Balloon Angioplasty;  Surgeon: Jettie Booze, MD;  Location: [facility];  Service: Cardiovascular;  Laterality: N/A;  Mid Diag  . CORONARY STENT INTERVENTION N/A 05/21/2017   Procedure: Coronary Stent Intervention;  Surgeon: Jettie Booze, MD;  Location: [facility];  Service: Cardiovascular;  Laterality: N/A;  Mid LAD,Mid CFX  . CORONARY STENT INTERVENTION N/A 10/09/2017   Procedure: CORONARY STENT INTERVENTION;  Surgeon: Belva Crome, MD;  Location: [facility];  Service: Cardiovascular;  Laterality: N/A;  . LEFT HEART CATH AND CORONARY ANGIOGRAPHY N/A 05/21/2017   Procedure: Left Heart Cath and Coronary Angiography;  Surgeon: Jettie Booze, MD;  Location: [facility];  Service: Cardiovascular;  Laterality: N/A;  . LEFT HEART CATH AND CORONARY ANGIOGRAPHY N/A 10/09/2017   Procedure: LEFT HEART CATH AND CORONARY ANGIOGRAPHY;  Surgeon: Belva Crome, MD;  Location: [facility];  Service: Cardiovascular;  Laterality: N/A;  . PARTIAL GASTRECTOMY    . ULTRASOUND GUIDANCE FOR VASCULAR ACCESS  10/09/2017   Procedure: Ultrasound Guidance For Vascular Access;  Surgeon: Belva Crome, MD;  Location: [facility];  Service: Cardiovascular;;    Social History:  reports that he has been smoking cigarettes. He has a 20.00 pack-year smoking history. He has never used smokeless tobacco. He reports current alcohol use. He reports that he does not use  drugs.  Allergies: Allergies  Allergen Reactions  . Brilinta [Ticagrelor] Shortness Of Breath  . Lisinopril Cough    Family History:  Family History  Problem Relation Age of Onset  . Hypertension Father      Current Outpatient Medications:  .  amLODipine (NORVASC) 10 MG tablet, Take 1 tablet (10 mg total) by mouth daily., Disp: 90 tablet, Rfl: 1 .  ASPIRIN 81 PO, Take 1 tablet by mouth daily., Disp: , Rfl:  .  atorvastatin (LIPITOR) 20 MG tablet, Take 1 tablet (20 mg total) by mouth daily., Disp: 90 tablet, Rfl: 3 .  clopidogrel (PLAVIX) 75 MG tablet, Take 1 tablet (75 mg total) by mouth daily., Disp: 90 tablet, Rfl: 3 .  ezetimibe (ZETIA) 10 MG tablet, Take 1 tablet (10 mg total) by mouth daily., Disp: 90 tablet, Rfl: 3 .  hydrochlorothiazide (HYDRODIURIL) 25 MG tablet, One tablet daily, Disp: 90 tablet, Rfl: 3 .  sildenafil (VIAGRA) 50 MG tablet, Take 1 tablet (50 mg total) by mouth daily as needed for erectile dysfunction., Disp: 10 tablet, Rfl: 0  Review of Systems:  Constitutional: Denies fever, chills, diaphoresis, appetite change and fatigue.  HEENT: Denies photophobia, eye pain, redness, hearing loss, ear pain, congestion, sore throat, rhinorrhea, sneezing, mouth sores, trouble swallowing, neck pain, neck stiffness and tinnitus.   Respiratory: Denies SOB, DOE, cough, chest tightness,  and wheezing.   Cardiovascular: Denies chest pain, palpitations and leg swelling.  Gastrointestinal: Denies nausea, vomiting, abdominal pain, diarrhea, constipation, and abdominal distention.  Genitourinary: Denies dysuria, urgency, frequency, hematuria, flank pain and difficulty urinating.  Endocrine: Denies: hot or cold intolerance, sweats, changes in hair or nails, polyuria, polydipsia. Musculoskeletal: Denies myalgias, back pain, joint swelling, arthralgias and gait problem.  Skin: Denies pallor, rash and wound.  Neurological: Denies dizziness, seizures, syncope, weakness,  light-headedness, numbness and headaches.  Hematological: Denies adenopathy. Easy bruising, personal or family bleeding history  Psychiatric/Behavioral: Denies suicidal ideation, mood changes, confusion, nervousness, sleep disturbance and agitation    Physical Exam: Vitals:   09/01/20 1139  BP: 140/88  Pulse: 80  Temp: 98.9 F (37.2 C)  TempSrc: Oral  SpO2: 95%  Weight: 195 lb 8 oz (88.7 kg)  Height: 5\' 10"  (1.778 m)   Body mass index is 28.05 kg/m.  Constitutional: NAD, calm, comfortable Eyes: PERRL, lids and conjunctivae normal wears corrective lenses ENMT: Mucous membranes are moist. Respiratory: clear to auscultation bilaterally, no wheezing, no crackles. Normal respiratory effort. No accessory muscle use.  Cardiovascular: Regular rate and rhythm, no murmurs / rubs / gallops. No extremity edema. 2+ pedal pulses. No carotid bruits.  Abdomen: no tenderness, no masses palpated. No hepatosplenomegaly. Bowel sounds positive.  Neurologic: Grossly intact and nonfocal Psychiatric: Normal judgment and insight. Alert and oriented x 3. Normal mood.    Impression and Plan:  Screening for malignant neoplasm of colon  - Plan: Ambulatory referral to Gastroenterology  Cigarette nicotine dependence without complication -I have discussed tobacco  cessation with the patient.  I have counseled the patient regarding the negative impacts of continued tobacco use including but not limited to lung cancer, COPD, and cardiovascular disease.  I have discussed alternatives to tobacco and modalities that may help facilitate tobacco cessation including but not limited to biofeedback, hypnosis, and medications.  Total time spent with tobacco counseling was 4 minutes. -He remains in the precontemplative stage. -We will continue to discuss at subsequent visits.  CAD in native artery -Followed closely by cardiology, he remains on dual antiplatelet therapy.  Primary hypertension  -Blood pressure remains  elevated. -He is on hydrochlorothiazide 25 mg and Norvasc 5 mg. -Increase amlodipine to 10 mg and return in 8 weeks for follow-up. -We have discussed low-sodium diet.  Hyperlipidemia, unspecified hyperlipidemia type -Not at goal with an LDL of 88 in May 2021. -At that time ezetimibe was added to atorvastatin. -When he returns in 8 weeks for recheck lipids as he is not fasting today.  Bilateral carotid artery stenosis -Carotid Doppler in May with bilateral 1 to 39% carotid artery stenosis.  Hematochezia  - Plan: CBC with Differential/Platelet -Sent to GI for further evaluation, he remains on dual antiplatelet therapy. -None recently but states it happens intermittently about every month or so.  Erectile dysfunction, unspecified erectile dysfunction type  - Plan: sildenafil (VIAGRA) 50 MG tablet, nitroglycerin is on his medication list, however he does not take it nor does he have an active bottle with him.  I have counseled him on usage with nitrates can cause significant hypotension.  Need for influenza vaccination -Flu vaccine administered today.  Need for vaccination against Streptococcus pneumoniae -Pneumonia vaccine administered today.  Prostate cancer screening  - Plan: PSA per request.    Patient Instructions  -Nice seeing you today!!  -Lab work today; will notify you once results are available.  -Flu and pneumonia vaccines today.  -GI referral for colonoscopy today.  -Increase amlodipine to 10 mg daily.  -May use sildenafil 50 mg as needed.  -Schedule follow up in 8 weeks. Please come in fasting that day.     Lelon Frohlich, MD [facility]

## 2020-09-02 LAB — PSA: PSA: 1.64 ng/mL (ref <=4.0)

## 2020-09-07 ENCOUNTER — Ambulatory Visit: Payer: Commercial Managed Care - PPO | Admitting: Nurse Practitioner

## 2020-10-12 ENCOUNTER — Encounter: Payer: Self-pay | Admitting: Gastroenterology

## 2020-11-02 ENCOUNTER — Encounter: Payer: Self-pay | Admitting: Gastroenterology

## 2020-11-02 ENCOUNTER — Ambulatory Visit (INDEPENDENT_AMBULATORY_CARE_PROVIDER_SITE_OTHER): Payer: Commercial Managed Care - PPO | Admitting: Gastroenterology

## 2020-11-02 ENCOUNTER — Telehealth: Payer: Self-pay

## 2020-11-02 VITALS — BP 132/74 | HR 56 | Ht 70.0 in | Wt 198.2 lb

## 2020-11-02 DIAGNOSIS — Z1211 Encounter for screening for malignant neoplasm of colon: Secondary | ICD-10-CM

## 2020-11-02 DIAGNOSIS — K625 Hemorrhage of anus and rectum: Secondary | ICD-10-CM | POA: Diagnosis not present

## 2020-11-02 DIAGNOSIS — Z7902 Long term (current) use of antithrombotics/antiplatelets: Secondary | ICD-10-CM

## 2020-11-02 MED ORDER — SUTAB 1479-225-188 MG PO TABS: 1.0000 | ORAL_TABLET | ORAL | 0 refills | Status: DC

## 2020-11-02 NOTE — Progress Notes (Signed)
11/02/2020 Lucas Norton [id number] Apr 25, 1961   HISTORY OF PRESENT ILLNESS:  This is a pleasant 60 year old male who is new to our office.  He is here today to schedule colonoscopy.  He's never had one in the past.  He admits to bright red rectal bleeding intermittently for months.  Sees it both on the toilet paper and in the toilet bowl at times.  He thinks that it is likely from hemorrhoids.  He is on Plavix for history of CAD and stents in 2018.  Follows with Dr. Johnsie Cancel.  No new issues since that time.  He denies abdominal pain or any issues with moving his bowels.  Referred here by her PCP, Dr. Jerilee Hoh.   Past Medical History:  Diagnosis Date  . Back pain   . CAD in native artery    a. NSTEMI 04/2017 s/p DES to mLAD and Cx, PTCA to D2, EF 50-55%.  Marland Kitchen HLD (hyperlipidemia)   . Hypertension   . NSTEMI (non-ST elevated myocardial infarction) ([place]) 05/21/2017   05/21/17 PCI/DES to mLAD, mLcx. EF normal  . Sinus bradycardia    unable to titrate BB further due to this  . Tobacco abuse    Past Surgical History:  Procedure Laterality Date  . BACK SURGERY    . CORONARY BALLOON ANGIOPLASTY N/A 05/21/2017   Procedure: Coronary Balloon Angioplasty;  Surgeon: Jettie Booze, MD;  Location: [facility];  Service: Cardiovascular;  Laterality: N/A;  Mid Diag  . CORONARY STENT INTERVENTION N/A 05/21/2017   Procedure: Coronary Stent Intervention;  Surgeon: Jettie Booze, MD;  Location: [facility];  Service: Cardiovascular;  Laterality: N/A;  Mid LAD,Mid CFX  . CORONARY STENT INTERVENTION N/A 10/09/2017   Procedure: CORONARY STENT INTERVENTION;  Surgeon: Belva Crome, MD;  Location: [facility];  Service: Cardiovascular;  Laterality: N/A;  . LEFT HEART CATH AND CORONARY ANGIOGRAPHY N/A 05/21/2017   Procedure: Left Heart Cath and Coronary Angiography;  Surgeon: Jettie Booze, MD;  Location: [facility];  Service: Cardiovascular;  Laterality: N/A;   . LEFT HEART CATH AND CORONARY ANGIOGRAPHY N/A 10/09/2017   Procedure: LEFT HEART CATH AND CORONARY ANGIOGRAPHY;  Surgeon: Belva Crome, MD;  Location: [facility];  Service: Cardiovascular;  Laterality: N/A;  . PARTIAL GASTRECTOMY    . ULTRASOUND GUIDANCE FOR VASCULAR ACCESS  10/09/2017   Procedure: Ultrasound Guidance For Vascular Access;  Surgeon: Belva Crome, MD;  Location: [facility];  Service: Cardiovascular;;    reports that he has been smoking cigarettes. He has a 20.00 pack-year smoking history. He has never used smokeless tobacco. He reports current alcohol use. He reports that he does not use drugs. family history includes Heart disease in his mother; Hypertension in his father. Allergies  Allergen Reactions  . Brilinta [Ticagrelor] Shortness Of Breath  . Lisinopril Cough      Outpatient Encounter Medications as of 11/02/2020  Medication Sig  . amLODipine (NORVASC) 10 MG tablet Take 1 tablet (10 mg total) by mouth daily.  . ASPIRIN 81 PO Take 1 tablet by mouth daily.  Marland Kitchen atorvastatin (LIPITOR) 20 MG tablet Take 1 tablet (20 mg total) by mouth daily.  . clopidogrel (PLAVIX) 75 MG tablet Take 1 tablet (75 mg total) by mouth daily.  Marland Kitchen ezetimibe (ZETIA) 10 MG tablet Take 1 tablet (10 mg total) by mouth daily.  . hydrochlorothiazide (HYDRODIURIL) 25 MG tablet One tablet daily  .  sildenafil (VIAGRA) 50 MG tablet Take 1 tablet (50 mg total) by mouth daily as needed for erectile dysfunction.   No facility-administered encounter medications on file as of 11/02/2020.     REVIEW OF SYSTEMS  : All other systems reviewed and negative except where noted in the History of Present Illness.   PHYSICAL EXAM: BP 132/74 (BP Location: Right Arm, Patient Position: Sitting, Cuff Size: Normal)   Pulse (!) 56   Ht 5\' 10"  (1.778 m)   Wt 198 lb 4 oz (89.9 kg)   BMI 28.45 kg/m  General: Well developed white male in no acute distress Head: Normocephalic and atraumatic Eyes:   Sclerae anicteric, conjunctiva pink. Ears: Normal auditory acuity Lungs: Clear throughout to auscultation; no W/R/R. Heart: Regular rate and rhythm; no M/R/G. Abdomen: Soft, non-distended.  BS present.  Non-tender. Rectal:  Will be done at the time of colonoscopy. Musculoskeletal: Symmetrical with no gross deformities  Skin: No lesions on visible extremities Extremities: No edema  Neurological: Alert oriented x 4, grossly non-focal Psychological:  Alert and cooperative. Normal mood and affect  ASSESSMENT AND PLAN: *Screening for CRC:  Never had colonoscopy in the past.  Will schedule colonoscopy with Dr. Havery Moros. *Rectal bleeding:  Bright red blood intermittently for months.  He thinks likely from hemorrhoids. *Chronic antiplatelet use:  Hold Plavix for 5 days before procedure - will instruct when and how to resume after procedure. Risks and benefits of procedure including bleeding, perforation, infection, missed lesions, medication reactions and possible hospitalization or surgery if complications occur explained. Additional rare but real risk of cardiovascular event such as heart attack or ischemia/infarct of other organs off of Plavix explained and need to seek urgent help if this occurs. Will communicate by phone or EMR with patient's prescribing provider, Dr. Johnsie Cancel, to confirm that holding Plavix is reasonable in this case.    CC:  Isaac Bliss, [place]*

## 2020-11-02 NOTE — Telephone Encounter (Signed)
Yes he may hold plavix

## 2020-11-02 NOTE — Telephone Encounter (Signed)
[facility] Pre-operative Risk Assessment     Request for surgical clearance:     Endoscopy Procedure  What type of surgery is being performed?     Colonoscopy  When is this surgery scheduled?     12/10/2020  What type of clearance is required ?   Pharmacy  Are there any medications that need to be held prior to surgery and how long? Plavix starting 5 days prior  Practice name and name of physician performing surgery?      [facility]  What is your office phone and fax number?      Phone- [phone]  [phone]  Anesthesia type (None, local, MAC, general) ?       MAC

## 2020-11-02 NOTE — Telephone Encounter (Signed)
Dr. Johnsie Cancel This patient had a STEMI in 04/2017 treated with DES to LAD and LCx, and POB of D2. He had another MI 09/2017 requiring DES to LCx and POB to D1. He was placed on longterm DAPT. Last seen in clinic with Truitt Merle 02/2020. We are asked to hold plavix x 5 days for colonoscopy. Can you please give recommendations?

## 2020-11-02 NOTE — Patient Instructions (Signed)
If you are age 60 or older, your body mass index should be between 23-30. Your Body mass index is 28.45 kg/m. If this is out of the aforementioned range listed, please consider follow up with your Primary Care Provider.  If you are age 26 or younger, your body mass index should be between 19-25. Your Body mass index is 28.45 kg/m. If this is out of the aformentioned range listed, please consider follow up with your Primary Care Provider.   You have been scheduled for a colonoscopy. Please follow written instructions given to you at your visit today.  Please pick up your prep supplies at the pharmacy within the next 1-3 days. If you use inhalers (even only as needed), please bring them with you on the day of your procedure.  You will be contacted by our office prior to your procedure for directions on holding your Plavix.  If you do not hear from our office 1 week prior to your scheduled procedure, please call [phone] to discuss.   Follow up pending the results of your Colonoscopy.  Thank you for entrusting me with your care and choosing [facility].  Alonza Bogus, PA-C

## 2020-11-03 NOTE — Telephone Encounter (Signed)
I have informed patient to hold plavix for 5 days prior to the GI procedure.

## 2020-11-04 ENCOUNTER — Other Ambulatory Visit: Payer: Self-pay

## 2020-11-04 ENCOUNTER — Encounter: Payer: Self-pay | Admitting: Internal Medicine

## 2020-11-04 ENCOUNTER — Telehealth: Payer: Self-pay | Admitting: Internal Medicine

## 2020-11-04 ENCOUNTER — Ambulatory Visit (INDEPENDENT_AMBULATORY_CARE_PROVIDER_SITE_OTHER): Payer: Commercial Managed Care - PPO | Admitting: Internal Medicine

## 2020-11-04 ENCOUNTER — Other Ambulatory Visit: Payer: Self-pay | Admitting: Internal Medicine

## 2020-11-04 VITALS — BP 140/90 | HR 92 | Temp 97.6°F | Ht 70.0 in | Wt 199.5 lb

## 2020-11-04 DIAGNOSIS — E559 Vitamin D deficiency, unspecified: Secondary | ICD-10-CM

## 2020-11-04 DIAGNOSIS — Z23 Encounter for immunization: Secondary | ICD-10-CM

## 2020-11-04 DIAGNOSIS — E785 Hyperlipidemia, unspecified: Secondary | ICD-10-CM | POA: Diagnosis not present

## 2020-11-04 DIAGNOSIS — Z0000 Encounter for general adult medical examination without abnormal findings: Secondary | ICD-10-CM | POA: Diagnosis not present

## 2020-11-04 DIAGNOSIS — Z720 Tobacco use: Secondary | ICD-10-CM

## 2020-11-04 DIAGNOSIS — E538 Deficiency of other specified B group vitamins: Secondary | ICD-10-CM | POA: Insufficient documentation

## 2020-11-04 DIAGNOSIS — I10 Essential (primary) hypertension: Secondary | ICD-10-CM

## 2020-11-04 DIAGNOSIS — I2510 Atherosclerotic heart disease of native coronary artery without angina pectoris: Secondary | ICD-10-CM

## 2020-11-04 DIAGNOSIS — I214 Non-ST elevation (NSTEMI) myocardial infarction: Secondary | ICD-10-CM

## 2020-11-04 LAB — TSH: TSH: 0.61 u[IU]/mL (ref 0.35–4.50)

## 2020-11-04 LAB — COMPREHENSIVE METABOLIC PANEL
ALT: 20 U/L (ref 0–53)
AST: 18 U/L (ref 0–37)
Albumin: 4.5 g/dL (ref 3.5–5.2)
Alkaline Phosphatase: 68 U/L (ref 39–117)
BUN: 18 mg/dL (ref 6–23)
CO2: 30 mEq/L (ref 19–32)
Calcium: 9.5 mg/dL (ref 8.4–10.5)
Chloride: 103 mEq/L (ref 96–112)
Creatinine, Ser: 1.07 mg/dL (ref 0.40–1.50)
GFR: 75.95 mL/min (ref >60.00)
Glucose, Bld: 99 mg/dL (ref 70–99)
Potassium: 4.4 mEq/L (ref 3.5–5.1)
Sodium: 138 mEq/L (ref 135–145)
Total Bilirubin: 0.6 mg/dL (ref 0.2–1.2)
Total Protein: 6.5 g/dL (ref 6.0–8.3)

## 2020-11-04 LAB — CBC WITH DIFFERENTIAL/PLATELET
Basophils Absolute: 0.1 10*3/uL (ref 0.0–0.1)
Basophils Relative: 0.9 % (ref 0.0–3.0)
Eosinophils Absolute: 0.2 10*3/uL (ref 0.0–0.7)
Eosinophils Relative: 2.8 % (ref 0.0–5.0)
HCT: 46.3 % (ref 39.0–52.0)
Hemoglobin: 16 g/dL (ref 13.0–17.0)
Lymphocytes Relative: 22.1 % (ref 12.0–46.0)
Lymphs Abs: 1.9 10*3/uL (ref 0.7–4.0)
MCHC: 34.6 g/dL (ref 30.0–36.0)
MCV: 91.4 fl (ref 78.0–100.0)
Monocytes Absolute: 0.5 10*3/uL (ref 0.1–1.0)
Monocytes Relative: 6.1 % (ref 3.0–12.0)
Neutro Abs: 5.8 10*3/uL (ref 1.4–7.7)
Neutrophils Relative %: 68.1 % (ref 43.0–77.0)
Platelets: 190 10*3/uL (ref 150.0–400.0)
RBC: 5.06 Mil/uL (ref 4.22–5.81)
RDW: 13.9 % (ref 11.5–15.5)
WBC: 8.5 10*3/uL (ref 4.0–10.5)

## 2020-11-04 LAB — LIPID PANEL
Cholesterol: 116 mg/dL (ref 0–200)
HDL: 38.7 mg/dL — ABNORMAL LOW (ref >39.00)
LDL Cholesterol: 58 mg/dL (ref 0–99)
NonHDL: 77.29
Total CHOL/HDL Ratio: 3
Triglycerides: 94 mg/dL (ref 0.0–149.0)
VLDL: 18.8 mg/dL (ref 0.0–40.0)

## 2020-11-04 LAB — VITAMIN B12: Vitamin B-12: 201 pg/mL — ABNORMAL LOW (ref 211–911)

## 2020-11-04 LAB — HEMOGLOBIN A1C: Hgb A1c MFr Bld: 5.6 % (ref 4.6–6.5)

## 2020-11-04 LAB — VITAMIN D 25 HYDROXY (VIT D DEFICIENCY, FRACTURES): VITD: 21.68 ng/mL — ABNORMAL LOW (ref 30.00–100.00)

## 2020-11-04 MED ORDER — "BD ECLIPSE SYRINGE 25G X 5/8"" 3 ML MISC": 0 refills | Status: AC

## 2020-11-04 MED ORDER — CYANOCOBALAMIN 1000 MCG/ML IJ SOLN: INTRAMUSCULAR | 1 refills | Status: DC

## 2020-11-04 MED ORDER — VITAMIN D (ERGOCALCIFEROL) 1.25 MG (50000 UNIT) PO CAPS: 50000.0000 [IU] | ORAL_CAPSULE | ORAL | 0 refills | Status: DC

## 2020-11-04 NOTE — Progress Notes (Signed)
Established Patient Office Visit     This visit occurred during the SARS-CoV-2 public health emergency.  Safety protocols were in place, including screening questions prior to the visit, additional usage of staff PPE, and extensive cleaning of exam room while observing appropriate contact time as indicated for disinfecting solutions.    CC/Reason for Visit: Annual preventive exam  HPI: Lucas Norton is a 60 y.o. male who is coming in today for the above mentioned reasons. Past Medical History is significant for: Coronary artery disease followed by cardiology, hyperlipidemia, hypertension, bilateral carotid artery stenosis with a carotid Doppler in May 2021 that showed bilateral 1 to 39% stenosis.  Ongoing tobacco abuse of half a pack a day for over 30 years.  He also has a chronic history of mild rectal bleeding that he believes is related to hemorrhoids.  He has had his initial GI consultation and is scheduled for colonoscopy next month.  He is due for his COVID booster, Tdap and shingles vaccines.  He had a normal PSA in November 2021.  He is due for routine eye and dental care.  He has no acute complaints today.   Past Medical/Surgical History: Past Medical History:  Diagnosis Date  . Back pain   . CAD in native artery    a. NSTEMI 04/2017 s/p DES to mLAD and Cx, PTCA to D2, EF 50-55%.  Marland Kitchen HLD (hyperlipidemia)   . Hypertension   . NSTEMI (non-ST elevated myocardial infarction) ([place]) 05/21/2017   05/21/17 PCI/DES to mLAD, mLcx. EF normal  . Sinus bradycardia    unable to titrate BB further due to this  . Tobacco abuse     Past Surgical History:  Procedure Laterality Date  . BACK SURGERY    . CORONARY BALLOON ANGIOPLASTY N/A 05/21/2017   Procedure: Coronary Balloon Angioplasty;  Surgeon: Jettie Booze, MD;  Location: [facility] [facility];  Service: Cardiovascular;  Laterality: N/A;  Mid Diag  . CORONARY STENT INTERVENTION N/A 05/21/2017   Procedure: Coronary Stent  Intervention;  Surgeon: Jettie Booze, MD;  Location: [place] [facility];  Service: Cardiovascular;  Laterality: N/A;  Mid LAD,Mid CFX  . CORONARY STENT INTERVENTION N/A 10/09/2017   Procedure: CORONARY STENT INTERVENTION;  Surgeon: Belva Crome, MD;  Location: [facility];  Service: Cardiovascular;  Laterality: N/A;  . LEFT HEART CATH AND CORONARY ANGIOGRAPHY N/A 05/21/2017   Procedure: Left Heart Cath and Coronary Angiography;  Surgeon: Jettie Booze, MD;  Location: [facility];  Service: Cardiovascular;  Laterality: N/A;  . LEFT HEART CATH AND CORONARY ANGIOGRAPHY N/A 10/09/2017   Procedure: LEFT HEART CATH AND CORONARY ANGIOGRAPHY;  Surgeon: Belva Crome, MD;  Location: [facility];  Service: Cardiovascular;  Laterality: N/A;  . PARTIAL GASTRECTOMY    . ULTRASOUND GUIDANCE FOR VASCULAR ACCESS  10/09/2017   Procedure: Ultrasound Guidance For Vascular Access;  Surgeon: Belva Crome, MD;  Location: [facility] [facility] [facility];  Service: Cardiovascular;;    Social History:  reports that he has been smoking cigarettes. He has a 20.00 pack-year smoking history. He has never used smokeless tobacco. He reports current alcohol use. He reports that he does not use drugs.  Allergies: Allergies  Allergen Reactions  . Brilinta [Ticagrelor] Shortness Of Breath  . Lisinopril Cough    Family History:  Family History  Problem Relation Age of Onset  . Hypertension Father   . Heart disease Mother   . Colon cancer Neg  Hx   . Esophageal cancer Neg Hx   . Pancreatic cancer Neg Hx   . Stomach cancer Neg Hx      Current Outpatient Medications:  .  amLODipine (NORVASC) 10 MG tablet, Take 1 tablet (10 mg total) by mouth daily., Disp: 90 tablet, Rfl: 1 .  ASPIRIN 81 PO, Take 1 tablet by mouth daily., Disp: , Rfl:  .  atorvastatin (LIPITOR) 20 MG tablet, Take 1 tablet (20 mg total) by mouth daily., Disp: 90 tablet, Rfl: 3 .  clopidogrel (PLAVIX) 75 MG tablet, Take 1  tablet (75 mg total) by mouth daily., Disp: 90 tablet, Rfl: 3 .  hydrochlorothiazide (HYDRODIURIL) 25 MG tablet, One tablet daily, Disp: 90 tablet, Rfl: 3 .  sildenafil (VIAGRA) 50 MG tablet, Take 1 tablet (50 mg total) by mouth daily as needed for erectile dysfunction., Disp: 10 tablet, Rfl: 0 .  Sodium Sulfate-Mag Sulfate-KCl (SUTAB) [id number] MG TABS, Take 1 kit by mouth as directed. MANUFACTURER CODES!! BIN: [id number] PCN: CN GROUP: [id number] MEMBER ID: [id number];TXM AS SECONDARY INSURANCE ;NO PRIOR AUTHORIZATION, Disp: 24 tablet, Rfl: 0 .  ezetimibe (ZETIA) 10 MG tablet, Take 1 tablet (10 mg total) by mouth daily., Disp: 90 tablet, Rfl: 3  Review of Systems:  Constitutional: Denies fever, chills, diaphoresis, appetite change and fatigue.  HEENT: Denies photophobia, eye pain, redness, hearing loss, ear pain, congestion, sore throat, rhinorrhea, sneezing, mouth sores, trouble swallowing, neck pain, neck stiffness and tinnitus.   Respiratory: Denies SOB, DOE, cough, chest tightness,  and wheezing.   Cardiovascular: Denies chest pain, palpitations and leg swelling.  Gastrointestinal: Denies nausea, vomiting, abdominal pain, diarrhea, constipation and abdominal distention.  Genitourinary: Denies dysuria, urgency, frequency, hematuria, flank pain and difficulty urinating.  Endocrine: Denies: hot or cold intolerance, sweats, changes in hair or nails, polyuria, polydipsia. Musculoskeletal: Denies myalgias, back pain, joint swelling, arthralgias and gait problem.  Skin: Denies pallor, rash and wound.  Neurological: Denies dizziness, seizures, syncope, weakness, light-headedness, numbness and headaches.  Hematological: Denies adenopathy. Easy bruising, personal or family bleeding history  Psychiatric/Behavioral: Denies suicidal ideation, mood changes, confusion, nervousness, sleep disturbance and agitation    Physical Exam: Vitals:   11/04/20 0703  BP: 140/90  Pulse: 92  Temp: 97.6 F (36.4  C)  TempSrc: Oral  SpO2: 94%  Weight: 199 lb 8 oz (90.5 kg)  Height: 5' 10" (1.778 m)    Body mass index is 28.63 kg/m.   Constitutional: NAD, calm, comfortable Eyes: PERRL, lids and conjunctivae normal, wears corrective lenses ENMT: Mucous membranes are moist. Posterior pharynx clear of any exudate or lesions. Normal dentition. Tympanic membrane is pearly white, no erythema or bulging. Neck: normal, supple, no masses, no thyromegaly Respiratory: clear to auscultation bilaterally, no wheezing, no crackles. Normal respiratory effort. No accessory muscle use.  Cardiovascular: Regular rate and rhythm, no murmurs / rubs / gallops. No extremity edema. 2+ pedal pulses.  Abdomen: no tenderness, no masses palpated. No hepatosplenomegaly. Bowel sounds positive.  Musculoskeletal: no clubbing / cyanosis. No joint deformity upper and lower extremities. Good ROM, no contractures. Normal muscle tone.  Skin: no rashes, lesions, ulcers. No induration Neurologic: CN 2-12 grossly intact. Sensation intact, DTR normal. Strength 5/5 in all 4.  Psychiatric: Normal judgment and insight. Alert and oriented x 3. Normal mood.    Impression and Plan:  Encounter for preventive health examination  -I have advised routine eye and dental care. -Tdap booster and first shingles vaccine today.  He is also due for his third COVID  booster, otherwise immunizations are up-to-date. -Screening labs today. -Healthy lifestyle discussed in detail. -He had a normal PSA for prostate cancer screening in November 2021 of 1.640. -He is scheduled for his first screening colonoscopy in February.  Tobacco abuse -He continues to smoke and remains in the precontemplative stage, I will continue to discuss with him at subsequent visits.  Hyperlipidemia, unspecified hyperlipidemia type  - Plan: Lipid panel -Last LDL was 88 in May 2021. -He is on Lipitor 20 mg and ezetimibe 10 mg daily. -Goal LDL less than 70.  Primary  hypertension -Blood pressure remains uncontrolled. -He is on amlodipine 10 mg and hydrochlorothiazide 25 mg daily. -He will do ambulatory blood pressure monitoring and report back to me in 4 to 6 weeks to decide if adjustment of medications is required.  CAD in native artery NSTEMI (non-ST elevated myocardial infarction) ([place]) -Followed by cardiology, no recent episodes of chest pain.  Need for Tdap vaccination -Tdap booster today.  Need for shingles vaccine -First shingles vaccine administered today.   Patient Instructions   -Nice seeing you today!!  -Lab work today; will notify you once results are available.  -Schedule follow up in 3 months. Please bring your home blood pressure measurements.   Preventive Care 47-68 Years Old, Male Preventive care refers to lifestyle choices and visits with your health care provider that can promote health and wellness. This includes:  A yearly physical exam. This is also called an annual wellness visit.  Regular dental and eye exams.  Immunizations.  Screening for certain conditions.  Healthy lifestyle choices, such as: ? Eating a healthy diet. ? Getting regular exercise. ? Not using drugs or products that contain nicotine and tobacco. ? Limiting alcohol use. What can I expect for my preventive care visit? Physical exam Your health care provider will check your:  Height and weight. These may be used to calculate your BMI (body mass index). BMI is a measurement that tells if you are at a healthy weight.  Heart rate and blood pressure.  Body temperature.  Skin for abnormal spots. Counseling Your health care provider may ask you questions about your:  Past medical problems.  Family's medical history.  Alcohol, tobacco, and drug use.  Emotional well-being.  Home life and relationship well-being.  Sexual activity.  Diet, exercise, and sleep habits.  Work and work Statistician.  Access to firearms. What  immunizations do I need? Vaccines are usually given at various ages, according to a schedule. Your health care provider will recommend vaccines for you based on your age, medical history, and lifestyle or other factors, such as travel or where you work.   What tests do I need? Blood tests  Lipid and cholesterol levels. These may be checked every 5 years, or more often if you are over 81 years old.  Hepatitis C test.  Hepatitis B test. Screening  Lung cancer screening. You may have this screening every year starting at age 32 if you have a 30-pack-year history of smoking and currently smoke or have quit within the past 15 years.  Prostate cancer screening. Recommendations will vary depending on your family history and other risks.  Genital exam to check for testicular cancer or hernias.  Colorectal cancer screening. ? All adults should have this screening starting at age 9 and continuing until age 81. ? Your health care provider may recommend screening at age 53 if you are at increased risk. ? You will have tests every 1-10 years, depending on your results and  the type of screening test.  Diabetes screening. ? This is done by checking your blood sugar (glucose) after you have not eaten for a while (fasting). ? You may have this done every 1-3 years.  STD (sexually transmitted disease) testing, if you are at risk. Follow these instructions at home: Eating and drinking  Eat a diet that includes fresh fruits and vegetables, whole grains, lean protein, and low-fat dairy products.  Take vitamin and mineral supplements as recommended by your health care provider.  Do not drink alcohol if your health care provider tells you not to drink.  If you drink alcohol: ? Limit how much you have to 0-2 drinks a day. ? Be aware of how much alcohol is in your drink. In the [place] one drink equals one 12 oz bottle of beer (355 mL), one 5 oz glass of wine (148 mL), or one 1 oz glass of hard liquor  (44 mL).   Lifestyle  Take daily care of your teeth and gums. Brush your teeth every morning and night with fluoride toothpaste. Floss one time each day.  Stay active. Exercise for at least 30 minutes 5 or more days each week.  Do not use any products that contain nicotine or tobacco, such as cigarettes, e-cigarettes, and chewing tobacco. If you need help quitting, ask your health care provider.  Do not use drugs.  If you are sexually active, practice safe sex. Use a condom or other form of protection to prevent STIs (sexually transmitted infections).  If told by your health care provider, take low-dose aspirin daily starting at age 71.  Find healthy ways to cope with stress, such as: ? Meditation, yoga, or listening to music. ? Journaling. ? Talking to a trusted person. ? Spending time with friends and family. Safety  Always wear your seat belt while driving or riding in a vehicle.  Do not drive: ? If you have been drinking alcohol. Do not ride with someone who has been drinking. ? When you are tired or distracted. ? While texting.  Wear a helmet and other protective equipment during sports activities.  If you have firearms in your house, make sure you follow all gun safety procedures. What's next?  Go to your health care provider once a year for an annual wellness visit.  Ask your health care provider how often you should have your eyes and teeth checked.  Stay up to date on all vaccines. This information is not intended to replace advice given to you by your health care provider. Make sure you discuss any questions you have with your health care provider. Document Revised: 07/09/2019 Document Reviewed: 10/04/2018 Elsevier Patient Education  2021 [place], MD [facility]

## 2020-11-04 NOTE — Addendum Note (Signed)
Addended by: Janann Colonel on: 11/04/2020 08:03 AM   Modules accepted: Orders

## 2020-11-04 NOTE — Telephone Encounter (Signed)
Pt call and want a call back to to talk about his B-12 .

## 2020-11-04 NOTE — Addendum Note (Signed)
Addended by: Agnes Lawrence on: 11/04/2020 03:54 PM   Modules accepted: Orders

## 2020-11-04 NOTE — Progress Notes (Signed)
Agree with assessment and plan as outlined.  

## 2020-11-04 NOTE — Addendum Note (Signed)
Addended by: Westley Hummer B on: 11/04/2020 02:46 PM   Modules accepted: Orders

## 2020-11-04 NOTE — Patient Instructions (Signed)
-Nice seeing you today!!  -Lab work today; will notify you once results are available.  -Schedule follow up in 3 months. Please bring your home blood pressure measurements.   Preventive Care 3-60 Years Old, Male Preventive care refers to lifestyle choices and visits with your health care provider that can promote health and wellness. This includes:  A yearly physical exam. This is also called an annual wellness visit.  Regular dental and eye exams.  Immunizations.  Screening for certain conditions.  Healthy lifestyle choices, such as: ? Eating a healthy diet. ? Getting regular exercise. ? Not using drugs or products that contain nicotine and tobacco. ? Limiting alcohol use. What can I expect for my preventive care visit? Physical exam Your health care provider will check your:  Height and weight. These may be used to calculate your BMI (body mass index). BMI is a measurement that tells if you are at a healthy weight.  Heart rate and blood pressure.  Body temperature.  Skin for abnormal spots. Counseling Your health care provider may ask you questions about your:  Past medical problems.  Family's medical history.  Alcohol, tobacco, and drug use.  Emotional well-being.  Home life and relationship well-being.  Sexual activity.  Diet, exercise, and sleep habits.  Work and work Statistician.  Access to firearms. What immunizations do I need? Vaccines are usually given at various ages, according to a schedule. Your health care provider will recommend vaccines for you based on your age, medical history, and lifestyle or other factors, such as travel or where you work.   What tests do I need? Blood tests  Lipid and cholesterol levels. These may be checked every 5 years, or more often if you are over 25 years old.  Hepatitis C test.  Hepatitis B test. Screening  Lung cancer screening. You may have this screening every year starting at age 93 if you have a  30-pack-year history of smoking and currently smoke or have quit within the past 15 years.  Prostate cancer screening. Recommendations will vary depending on your family history and other risks.  Genital exam to check for testicular cancer or hernias.  Colorectal cancer screening. ? All adults should have this screening starting at age 30 and continuing until age 60. ? Your health care provider may recommend screening at age 66 if you are at increased risk. ? You will have tests every 1-10 years, depending on your results and the type of screening test.  Diabetes screening. ? This is done by checking your blood sugar (glucose) after you have not eaten for a while (fasting). ? You may have this done every 1-3 years.  STD (sexually transmitted disease) testing, if you are at risk. Follow these instructions at home: Eating and drinking  Eat a diet that includes fresh fruits and vegetables, whole grains, lean protein, and low-fat dairy products.  Take vitamin and mineral supplements as recommended by your health care provider.  Do not drink alcohol if your health care provider tells you not to drink.  If you drink alcohol: ? Limit how much you have to 0-2 drinks a day. ? Be aware of how much alcohol is in your drink. In the [place] one drink equals one 12 oz bottle of beer (355 mL), one 5 oz glass of wine (148 mL), or one 1 oz glass of hard liquor (44 mL).   Lifestyle  Take daily care of your teeth and gums. Brush your teeth every morning and night with fluoride  toothpaste. Floss one time each day.  Stay active. Exercise for at least 30 minutes 5 or more days each week.  Do not use any products that contain nicotine or tobacco, such as cigarettes, e-cigarettes, and chewing tobacco. If you need help quitting, ask your health care provider.  Do not use drugs.  If you are sexually active, practice safe sex. Use a condom or other form of protection to prevent STIs (sexually transmitted  infections).  If told by your health care provider, take low-dose aspirin daily starting at age 33.  Find healthy ways to cope with stress, such as: ? Meditation, yoga, or listening to music. ? Journaling. ? Talking to a trusted person. ? Spending time with friends and family. Safety  Always wear your seat belt while driving or riding in a vehicle.  Do not drive: ? If you have been drinking alcohol. Do not ride with someone who has been drinking. ? When you are tired or distracted. ? While texting.  Wear a helmet and other protective equipment during sports activities.  If you have firearms in your house, make sure you follow all gun safety procedures. What's next?  Go to your health care provider once a year for an annual wellness visit.  Ask your health care provider how often you should have your eyes and teeth checked.  Stay up to date on all vaccines. This information is not intended to replace advice given to you by your health care provider. Make sure you discuss any questions you have with your health care provider. Document Revised: 07/09/2019 Document Reviewed: 10/04/2018 Elsevier Patient Education  2021 [employer].

## 2020-11-04 NOTE — Addendum Note (Signed)
Addended by: Agnes Lawrence on: 11/04/2020 03:57 PM   Modules accepted: Orders

## 2020-11-05 NOTE — Telephone Encounter (Signed)
Spoke with the pt and he stated he was told by the pharmacy  the B12 injections are not covered and he questioned if he could come to the office instead?  I spoke with Gerald Stabs, the pharmacist at [facility] and he stated the Rx for B12 went through the pts insurance and will cost $18, the needles are not covered and the cost for those are $15 for a 25 count.  Left a detailed message with this information at the pts cell number.

## 2020-11-09 ENCOUNTER — Encounter: Payer: Self-pay | Admitting: Internal Medicine

## 2020-11-09 NOTE — Telephone Encounter (Signed)
   Primary Cardiologist: Jenkins Rouge, MD  Chart reviewed as part of pre-operative protocol coverage.   We have been asked for guidance to hold plavix for endoscopy. Per Dr. Johnsie Cancel, may hold plavix for 5 days prior to colonoscopy.  I will route this recommendation to the requesting party via Epic fax function and remove from pre-op pool. Please call with questions.  Tami Lin Aanshi Batchelder, PA 11/09/2020, 9:45 AM

## 2020-12-04 ENCOUNTER — Encounter: Payer: Self-pay | Admitting: Gastroenterology

## 2020-12-09 ENCOUNTER — Encounter: Payer: Self-pay | Admitting: Certified Registered Nurse Anesthetist

## 2020-12-10 ENCOUNTER — Encounter: Payer: Self-pay | Admitting: Gastroenterology

## 2020-12-10 ENCOUNTER — Ambulatory Visit (AMBULATORY_SURGERY_CENTER): Payer: Commercial Managed Care - PPO | Admitting: Gastroenterology

## 2020-12-10 ENCOUNTER — Other Ambulatory Visit: Payer: Self-pay

## 2020-12-10 VITALS — BP 120/79 | HR 70 | Temp 97.7°F | Resp 13 | Ht 70.0 in | Wt 198.0 lb

## 2020-12-10 DIAGNOSIS — K648 Other hemorrhoids: Secondary | ICD-10-CM

## 2020-12-10 DIAGNOSIS — D127 Benign neoplasm of rectosigmoid junction: Secondary | ICD-10-CM

## 2020-12-10 DIAGNOSIS — K625 Hemorrhage of anus and rectum: Secondary | ICD-10-CM

## 2020-12-10 DIAGNOSIS — K5730 Diverticulosis of large intestine without perforation or abscess without bleeding: Secondary | ICD-10-CM

## 2020-12-10 DIAGNOSIS — K635 Polyp of colon: Secondary | ICD-10-CM | POA: Diagnosis not present

## 2020-12-10 DIAGNOSIS — Z1211 Encounter for screening for malignant neoplasm of colon: Secondary | ICD-10-CM

## 2020-12-10 DIAGNOSIS — D125 Benign neoplasm of sigmoid colon: Secondary | ICD-10-CM

## 2020-12-10 HISTORY — PX: COLONOSCOPY: SHX174

## 2020-12-10 MED ORDER — SODIUM CHLORIDE 0.9 % IV SOLN: 500.0000 mL | Freq: Once | INTRAVENOUS | Status: DC

## 2020-12-10 MED ORDER — HYDROCORTISONE ACETATE 25 MG RE SUPP: 25.0000 mg | Freq: Every day | RECTAL | 0 refills | Status: DC

## 2020-12-10 NOTE — Progress Notes (Signed)
1550 Robinul 0.1 mg IV given due large amount of secretions upon assessment.  MD made aware, vss

## 2020-12-10 NOTE — Progress Notes (Signed)
Pt's states no medical or surgical changes since previsit or office visit.  ° °Vitals CW °

## 2020-12-10 NOTE — Patient Instructions (Signed)
Handouts given for polyps, hemorrhoids and diverticulosis.  Also for Hemorrhoid banding procedure, (done in the office).  You may resume your Plavix tomorrow.  Start the hemorrhoid suppositories tonight.  Await biopsy results.  YOU HAD AN ENDOSCOPIC PROCEDURE TODAY AT [facility]:   Refer to the procedure report that was given to you for any specific questions about what was found during the examination.  If the procedure report does not answer your questions, please call your gastroenterologist to clarify.  If you requested that your care partner not be given the details of your procedure findings, then the procedure report has been included in a sealed envelope for you to review at your convenience later.  YOU SHOULD EXPECT: Some feelings of bloating in the abdomen. Passage of more gas than usual.  Walking can help get rid of the air that was put into your GI tract during the procedure and reduce the bloating. If you had a lower endoscopy (such as a colonoscopy or flexible sigmoidoscopy) you may notice spotting of blood in your stool or on the toilet paper. If you underwent a bowel prep for your procedure, you may not have a normal bowel movement for a few days.  Please Note:  You might notice some irritation and congestion in your nose or some drainage.  This is from the oxygen used during your procedure.  There is no need for concern and it should clear up in a day or so.  SYMPTOMS TO REPORT IMMEDIATELY:   Following lower endoscopy (colonoscopy or flexible sigmoidoscopy):  Excessive amounts of blood in the stool  Significant tenderness or worsening of abdominal pains  Swelling of the abdomen that is new, acute  Fever of 100F or higher  For urgent or emergent issues, a gastroenterologist can be reached at any hour by calling [phone]. Do not use MyChart messaging for urgent concerns.    DIET:  We do recommend a small meal at first, but then you may proceed to  your regular diet.  Drink plenty of fluids but you should avoid alcoholic beverages for 24 hours.  ACTIVITY:  You should plan to take it easy for the rest of today and you should NOT DRIVE or use heavy machinery until tomorrow (because of the sedation medicines used during the test).    FOLLOW UP: Our staff will call the number listed on your records 48-72 hours following your procedure to check on you and address any questions or concerns that you may have regarding the information given to you following your procedure. If we do not reach you, we will leave a message.  We will attempt to reach you two times.  During this call, we will ask if you have developed any symptoms of COVID 19. If you develop any symptoms (ie: fever, flu-like symptoms, shortness of breath, cough etc.) before then, please call [phone].  If you test positive for Covid 19 in the 2 weeks post procedure, please call and report this information to [place].    If any biopsies were taken you will be contacted by phone or by letter within the next 1-3 weeks.  Please call us at [phone] if you have not heard about the biopsies in 3 weeks.    SIGNATURES/CONFIDENTIALITY: You and/or your care partner have signed paperwork which will be entered into your electronic medical record.  These signatures attest to the fact that that the information above on your After Visit Summary has been reviewed and is understood.  Full responsibility of the confidentiality of this discharge information lies with you and/or your care-partner.

## 2020-12-10 NOTE — Progress Notes (Signed)
Report given to PACU, vss 

## 2020-12-10 NOTE — Op Note (Signed)
[facility] Patient Name: Lucas Norton Procedure Date: 12/10/2020 3:44 PM MRN: [id number] Endoscopist: Lucas Norton , MD Age: 60 Referring MD:  Date of Birth: 1961/01/08 Gender: Male Account #: [id number] Procedure:                Colonoscopy Indications:              This is the patient's first colonoscopy, Rectal                            bleeding - on Plavix, patient endorses symptomatic                            hemorrhoids Medicines:                Monitored Anesthesia Care Procedure:                Pre-Anesthesia Assessment:                           - Prior to the procedure, a History and Physical                            was performed, and patient medications and                            allergies were reviewed. The patient's tolerance of                            previous anesthesia was also reviewed. The risks                            and benefits of the procedure and the sedation                            options and risks were discussed with the patient.                            All questions were answered, and informed consent                            was obtained. Prior Anticoagulants: The patient has                            taken Plavix (clopidogrel), last dose was 5 days                            prior to procedure. ASA Grade Assessment: III - A                            patient with severe systemic disease. After                            reviewing the risks and benefits, the patient was  deemed in satisfactory condition to undergo the                            procedure.                           After obtaining informed consent, the colonoscope                            was passed under direct vision. Throughout the                            procedure, the patient's blood pressure, pulse, and                            oxygen saturations were monitored continuously. The                             Olympus CF-[id number]) Colonoscope was                            introduced through the anus and advanced to the the                            cecum, identified by appendiceal orifice and                            ileocecal valve. The colonoscopy was performed                            without difficulty. The patient tolerated the                            procedure well. The quality of the bowel                            preparation was good. The ileocecal valve,                            appendiceal orifice, and rectum were photographed. Scope In: 3:53:19 PM Scope Out: 4:22:53 PM Scope Withdrawal Time: 0 hours 24 minutes 55 seconds  Total Procedure Duration: 0 hours 29 minutes 34 seconds  Findings:                 Hemorrhoids were found on perianal exam with                            stigmata of recent bleeding.                           Many medium-mouthed diverticula were found in the                            transverse colon and left colon. Superficial  erythematous changes noted in the sigmoid colon due                            to diverticulosis.                           Two sessile polyps were found in the recto-sigmoid                            colon and sigmoid colon. The polyps were 3 to 4 mm                            in size. These polyps were removed with a cold                            snare. Resection and retrieval were complete.                           Internal hemorrhoids were found during retroflexion.                           There was spasm in the entire colon, and along with                            the patient having coughing throughout the exam                            (managed per anesthesia).                           The exam was otherwise without abnormality. Complications:            No immediate complications. Estimated blood loss:                            Minimal. Estimated Blood Loss:     Estimated  blood loss was minimal. Impression:               - Hemorrhoids found on perianal exam with stigmata                            of recent bleeding.                           - Diverticulosis in the transverse colon and in the                            left colon.                           - Two 3 to 4 mm polyps at the recto-sigmoid colon                            and in the sigmoid colon, removed with a cold  snare. Resected and retrieved.                           - Internal hemorrhoids.                           - Colonic spasm.                           - The examination was otherwise normal.                           Rectal bleeding is due to internal hemorrhoids Recommendation:           - Patient has a contact number available for                            emergencies. The signs and symptoms of potential                            delayed complications were discussed with the                            patient. Return to normal activities tomorrow.                            Written discharge instructions were provided to the                            patient.                           - Resume previous diet.                           - Continue present medications.                           - Resume Plavix tomorrow                           - Await pathology results.                           - Follow up in the clinic for hemorrhoid banding as                            discussed                           - Start Anusol suppository once daily for now to                            treat hemorrhoids Lucas Lipps P. Havery Moros, MD 12/10/2020 4:32:28 PM This report has been signed electronically.

## 2020-12-10 NOTE — Progress Notes (Signed)
Called to room to assist during endoscopic procedure.  Patient ID and intended procedure confirmed with present staff. Received instructions for my participation in the procedure from the performing physician.  

## 2020-12-11 ENCOUNTER — Telehealth: Payer: Self-pay

## 2020-12-11 NOTE — Telephone Encounter (Signed)
Per 12/10/20 procedure note - Follow up in clinic for hemorrhoid banding   Spoke with patient and he has been scheduled for his 1st hemorrhoid banding on Tuesday, 12/29/20 at 3:40 PM. Patient verbalized understanding and had no concerns at the hospital.

## 2020-12-14 ENCOUNTER — Telehealth: Payer: Self-pay

## 2020-12-14 NOTE — Telephone Encounter (Signed)
Left message on 2nd follow up call. 

## 2020-12-14 NOTE — Telephone Encounter (Signed)
Patient has been scheduled for 1st banding appointment in March.

## 2020-12-14 NOTE — Telephone Encounter (Signed)
Attempted to reach pt. with follow-up call following endoscopic procedure 12/10/2020.  LM On pt. Voice mail.  Will try to reach pt. again later today.

## 2020-12-14 NOTE — Telephone Encounter (Signed)
-----   Message from Roetta Sessions, [place] sent at 12/10/2020  5:35 PM EST ----- Regarding: FW: banding  ----- Message ----- From: Yetta Flock, MD Sent: 12/10/2020   4:41 PM EST To: Roetta Sessions, CMA Subject: banding                                        Jan can you book this patient for a routine banding? He had his procedure today, can call him sometime next week or so. Thank you!

## 2020-12-29 ENCOUNTER — Encounter: Payer: Self-pay | Admitting: Gastroenterology

## 2020-12-29 ENCOUNTER — Ambulatory Visit (INDEPENDENT_AMBULATORY_CARE_PROVIDER_SITE_OTHER): Payer: Commercial Managed Care - PPO | Admitting: Gastroenterology

## 2020-12-29 VITALS — BP 132/82 | HR 86 | Ht 70.0 in | Wt 195.0 lb

## 2020-12-29 DIAGNOSIS — Z7902 Long term (current) use of antithrombotics/antiplatelets: Secondary | ICD-10-CM | POA: Diagnosis not present

## 2020-12-29 DIAGNOSIS — K641 Second degree hemorrhoids: Secondary | ICD-10-CM

## 2020-12-29 NOTE — Progress Notes (Signed)
60 y/o male here for a follow up visit for hemorrhoid banding.  He had a colonoscopy with me for rectal bleeding in February, had some benign hyperplastic polyps removed and hemorrhoids with stigmata of recent bleeding.  He has had chronic rectal bleeding intermittently for years.  He also has pruritus and irritation as well as grade 2 prolapse.  He has used Anusol in the past which has not helped this.  He denies any constipation or straining.  We had discussed options for treatment of hemorrhoids to include surgery or hemorrhoid banding, etc. He wans to avoid surgery if possible.  I offered him a hemorrhoid banding today.  We discussed risks of banding to include pain and namely bleeding in his particular case given his Plavix use.  Risk of bleeding is higher than average being on Plavix.  I discussed with him that we can do the banding on Plavix as risks for bleeding is upwards of 2 weeks after the band is placed, holding the Plavix during that time carries increased cardiovascular risk.  Further, if we do proceed with banding, and he wishes to complete the series it would be for 3 separate bandings.  We discussed performing banding on Plavix versus performing banding off Plavix, I think cardiovascular risks off Plavix outweigh risks of bleeding on Plavix.  After discussion about these issues and his persistent symptoms despite conservative therapy for hemorrhoids, he wants to proceed with banding today on Plavix.    Colonoscopy 12/10/20  - Hemorrhoids were found on perianal exam with stigmata of recent bleeding. - Many medium-mouthed diverticula were found in the transverse colon and left colon. Superficial erythematous changes noted in the sigmoid colon due to diverticulosis. - Two sessile polyps were found in the recto-sigmoid colon and sigmoid colon. The polyps were 3 to 4 mm in size. These polyps were removed with a cold snare. Resection and retrieval were complete. - Internal hemorrhoids were found  during retroflexion. - There was spasm in the entire colon, and along with the patient having coughing throughout the exam (managed per anesthesia). - The exam was otherwise without abnormality.  Diagnosis Surgical [P], colon, sigmoid x 1, rectosigmoid x 1, polyp (2) - HYPERPLASTIC POLYP(S)     PROCEDURE NOTE: The patient presents with symptomatic grade II  hemorrhoids, requesting rubber band ligation of his/her hemorrhoidal disease.  All risks, benefits and alternative forms of therapy were described and informed consent was obtained.  The anorectum was pre-medicated with 0.125% nitroglycerin The decision was made to band the LL internal hemorrhoid, and the [place] was used to perform band ligation without complication.  Digital anorectal examination was then performed to assure proper positioning of the band, and to adjust the banded tissue as required.  The patient was discharged home without pain or other issues.  Dietary and behavioral recommendations were given and along with follow-up instructions.     The following adjunctive treatments were recommended: Avoid straining, no heavy lifting  The patient will return in 2-4 weeks for  follow-up and possible additional banding as required. No complications were encountered and the patient tolerated the procedure well.  Mohave Cellar, MD [facility]

## 2020-12-29 NOTE — Patient Instructions (Addendum)
If you are age 60 or older, your body mass index should be between 23-30. Your Body mass index is 27.98 kg/m. If this is out of the aforementioned range listed, please consider follow up with your Primary Care Provider.  If you are age 78 or younger, your body mass index should be between 19-25. Your Body mass index is 27.98 kg/m. If this is out of the aformentioned range listed, please consider follow up with your Primary Care Provider.    HEMORRHOID BANDING PROCEDURE    FOLLOW-UP CARE   1. The procedure you have had should have been relatively painless since the banding of the area involved does not have nerve endings and there is no pain sensation.  The rubber band cuts off the blood supply to the hemorrhoid and the band may fall off as soon as 48 hours after the banding (the band may occasionally be seen in the toilet bowl following a bowel movement). You may notice a temporary feeling of fullness in the rectum which should respond adequately to plain Tylenol or Motrin.  2. Following the banding, avoid strenuous exercise that evening and resume full activity the next day.  A sitz bath (soaking in a warm tub) or bidet is soothing, and can be useful for cleansing the area after bowel movements.     3. To avoid constipation, take two tablespoons of natural wheat bran, natural oat bran, flax, Benefiber or any over the counter fiber supplement and increase your water intake to 7-8 glasses daily.    4. Unless you have been prescribed anorectal medication, do not put anything inside your rectum for two weeks: No suppositories, enemas, fingers, etc.  5. Occasionally, you may have more bleeding than usual after the banding procedure.  This is often from the untreated hemorrhoids rather than the treated one.  Don't be concerned if there is a tablespoon or so of blood.  If there is more blood than this, lie flat with your bottom higher than your head and apply an ice pack to the area. If the  bleeding does not stop within a half an hour or if you feel faint, call our office at [phone] or go to the emergency room.  6. Problems are not common; however, if there is a substantial amount of bleeding, severe pain, chills, fever or difficulty passing urine (very rare) or other problems, you should call us at [phone] or report to the nearest emergency room.  7. Do not stay seated continuously for more than 2-3 hours for a day or two after the procedure.   Tighten your buttock muscles 10-15 times every two hours and take 10-15 deep breaths every  1-2 hours.  Do not spend more than a few minutes on the toilet if you cannot empty your bowel;  instead re-visit the toilet at a later time.   We have you scheduled for your second banding appointment  on Tuesday, 4-19-2 at 3:40pm.   Thank you for entrusting me with your care and for choosing [facility], Dr. [place] Cellar

## 2021-01-18 NOTE — Progress Notes (Signed)
Cardiology Office Note:    Date:  01/19/2021   ID:  Lucas Norton, DOB 1960-11-14, MRN [id number]  PCP:  Lucas Norton, Lucas Halsted, MD   [place]  Cardiologist:  Lucas Rouge, MD   Electrophysiologist:  None       Referring MD: Lucas Norton, [place]*   Chief Complaint:  Follow-up (CAD)    Patient Profile:    Lucas Norton is a 60 y.o. male with:   Coronary artery disease   STEMI in 7/18 s/p DES to mLAD and LCx and POBA to D2  NSTEMI 12/18: LAD, Dx PCI sites patent; LCx stent 85 ISR >> s/p DES  Indefinite DAPT   Hypertension   Hyperlipidemia   Tobacco abuse  Carotid artery dz  [place] 5/21: bilat 1-39  Prior CV studies: Carotid US 03/04/20 Bilateral ICA 1-39; L subclavian stenosis   Cardiac catheterization 10/09/17  Non-ST elevation MI presentation in this gentleman now 5-1/2 months post LAD and mid circumflex DES implantation.  Short left main  Widely patent LAD including the second diagonal which received POBA for diffuse high grade stenosis.  Both the LAD stent and diagonal angioplasty sites are widely patent.  The circumflex stent contains 85% proximal margin restenosis.  The vessel is otherwise patent with the exception of 20-30% focal stenoses in the proximal and mid vessel with ostial 30-40% narrowing.  Widely patent right coronary artery.  Successful stenting of the mid circumflex from 85% to 0% using a 12 x 3.5 mm Synergy postdilated to 3.75 mm in diameter at 15 atm.  The new stent overlapped the proximal margin of the previously placed stent.  EF 55-65    Echocardiogram 10/09/17 EF 606-5, no RWMA, Gr 1 DD, mild MR, calcified AV      History of Present Illness:    Lucas Norton was last seen by Truitt Merle, NP in 5/21. He returns for f/u.  He is here alone.  Overall, he has been doing well.  He has not had chest discomfort or significant shortness of breath.  He sometimes has difficulty laying flat.  This is been a  chronic issue without significant change.  He does have some leg swelling.  This resolves with leg elevation.  He has not had syncope.  He has noted dry cough since his most recent colonoscopy.         Past Medical History:  Diagnosis Date  . Back pain   . CAD in native artery    a. NSTEMI 04/2017 s/p DES to mLAD and Cx, PTCA to D2, EF 50-55%.  Marland Kitchen HLD (hyperlipidemia)   . Hypertension   . NSTEMI (non-ST elevated myocardial infarction) ([place]) 05/21/2017   05/21/17 PCI/DES to mLAD, mLcx. EF normal  . Sinus bradycardia    unable to titrate BB further due to this  . Tobacco abuse     Current Medications: Current Meds  Medication Sig  . ASPIRIN 81 PO Take 1 tablet by mouth daily.  . cyanocobalamin (,VITAMIN B-12,) 1000 MCG/ML injection Inject 32m into the muscle once a week for 4 weeks, then once a month thereafter  . sildenafil (VIAGRA) 50 MG tablet Take 1 tablet (50 mg total) by mouth daily as needed for erectile dysfunction.  . SYRINGE-NEEDLE, DISP, 3 ML (BD ECLIPSE SYRINGE) 25G X 5/8" 3 ML MISC Use as directed  . valsartan (DIOVAN) 80 MG tablet Take 1 tablet (80 mg total) by mouth daily.  . Vitamin D, Ergocalciferol, (DRISDOL) 1.25 MG (50000 UNIT) CAPS  capsule Take 1 capsule (50,000 Units total) by mouth every 7 (seven) days for 12 doses.  . [DISCONTINUED] amLODipine (NORVASC) 10 MG tablet Take 1 tablet (10 mg total) by mouth daily.  . [DISCONTINUED] atorvastatin (LIPITOR) 20 MG tablet Take 1 tablet (20 mg total) by mouth daily.  . [DISCONTINUED] clopidogrel (PLAVIX) 75 MG tablet Take 1 tablet (75 mg total) by mouth daily.  . [DISCONTINUED] ezetimibe (ZETIA) 10 MG tablet Take 1 tablet (10 mg total) by mouth daily.  . [DISCONTINUED] ezetimibe (ZETIA) 10 MG tablet Take 10 mg by mouth daily.  . [DISCONTINUED] hydrochlorothiazide (HYDRODIURIL) 25 MG tablet One tablet daily     Allergies:   Brilinta [ticagrelor] and Lisinopril   Social History   Tobacco Use  . Smoking status: Current  Some Day Smoker    Packs/day: 1.00    Years: 20.00    Pack years: 20.00    Types: Cigarettes  . Smokeless tobacco: Never Used  Vaping Use  . Vaping Use: Never used  Substance Use Topics  . Alcohol use: Yes    Comment: socially  . Drug use: No     Family Hx: The patient's family history includes Heart disease in his mother; Hypertension in his father. There is no history of Colon cancer, Esophageal cancer, Pancreatic cancer, Stomach cancer, Colon polyps, or Rectal cancer.  Review of Systems  Eyes: Negative for blurred vision.  Neurological: Negative for headaches.     EKGs/Labs/Other Test Reviewed:    EKG:  EKG is not  ordered today.  The ekg ordered today demonstrates n/a  Recent Labs: 11/04/2020: ALT 20; BUN 18; Creatinine, Ser 1.07; Hemoglobin 16.0; Platelets 190.0; Potassium 4.4; Sodium 138; TSH 0.61   Recent Lipid Panel Lab Results  Component Value Date/Time   CHOL 116 11/04/2020 07:39 AM   CHOL 146 03/04/2020 09:50 AM   TRIG 94.0 11/04/2020 07:39 AM   HDL 38.70 (L) 11/04/2020 07:39 AM   HDL 42 03/04/2020 09:50 AM   CHOLHDL 3 11/04/2020 07:39 AM   LDLCALC 58 11/04/2020 07:39 AM   LDLCALC 88 03/04/2020 09:50 AM      Risk Assessment/Calculations:      Physical Exam:    VS:  BP (!) 170/70   Pulse 66   Ht '5\' 10"'  (1.778 m)   Wt 197 lb (89.4 kg)   SpO2 94%   BMI 28.27 kg/m     Wt Readings from Last 3 Encounters:  01/19/21 197 lb (89.4 kg)  12/29/20 195 lb (88.5 kg)  12/10/20 198 lb (89.8 kg)     Constitutional:      Appearance: Healthy appearance. Not in distress.  Neck:     Thyroid: No thyromegaly.     Vascular: JVD normal.  Pulmonary:     Effort: Pulmonary effort is normal.     Breath sounds: No wheezing. No rales.  Cardiovascular:     Normal rate. Regular rhythm. Normal S1. Normal S2.     Murmurs: There is no murmur.  Edema:    Pretibial: bilateral trace edema of the pretibial area. Abdominal:     Palpations: Abdomen is soft. There is no  hepatomegaly.  Skin:    General: Skin is warm and dry.  Neurological:     General: No focal deficit present.     Mental Status: Alert and oriented to person, place and time.     Cranial Nerves: Cranial nerves are intact.         ASSESSMENT & PLAN:    1.  Coronary artery disease involving native coronary artery of native heart without angina pectoris History of STEMI in 7/18 treated with a DES to the mid LAD and LCx and balloon angioplasty to the D2.  He had a non-STEMI 5 months later which was treated with a DES to the LCx secondary to in-stent restenosis.  Currently doing well without anginal symptoms.  He remains on long-term dual antiplatelet therapy.  Continue current dose of amlodipine, atorvastatin, clopidogrel, ezetimibe.  Note, he is moving to [facility] in the next month.  2. Essential hypertension Blood pressure is uncontrolled.  Repeat by me was 160/110.  He has taken his medications today.  He does not eat a high salt load.  Continue current dose of amlodipine, chlorothiazide.  He previously had a cough with ACE inhibitor.  I will place him on valsartan 80 mg daily.  Obtain BMET in [redacted] weeks along with follow-up with me.  3. Hyperlipidemia, unspecified hyperlipidemia type LDL optimal on most recent lab work.  Continue current Rx.    4. Venous insufficiency He seems to have lower extremity swelling related to venous insufficiency.  I have recommended he try to keep his legs elevated when seated as well as to wear compression stockings.  5. Tobacco abuse We discussed the importance of quitting         Dispo:  Return in about 2 weeks (around 02/02/2021) for Close Follow Up, w/ Richardson Dopp, PA-C, in person.   Medication Adjustments/Labs and Tests Ordered: Current medicines are reviewed at length with the patient today.  Concerns regarding medicines are outlined above.  Tests Ordered: No orders of the defined types were placed in this encounter.  Medication Changes: Meds  ordered this encounter  Medications  . valsartan (DIOVAN) 80 MG tablet    Sig: Take 1 tablet (80 mg total) by mouth daily.    Dispense:  30 tablet    Refill:  1  . amLODipine (NORVASC) 10 MG tablet    Sig: Take 1 tablet (10 mg total) by mouth daily.    Dispense:  90 tablet    Refill:  1  . atorvastatin (LIPITOR) 20 MG tablet    Sig: Take 1 tablet (20 mg total) by mouth daily.    Dispense:  90 tablet    Refill:  3  . clopidogrel (PLAVIX) 75 MG tablet    Sig: Take 1 tablet (75 mg total) by mouth daily.    Dispense:  90 tablet    Refill:  3  . ezetimibe (ZETIA) 10 MG tablet    Sig: Take 1 tablet (10 mg total) by mouth daily.    Dispense:  90 tablet    Refill:  3  . hydrochlorothiazide (HYDRODIURIL) 25 MG tablet    Sig: One tablet daily    Dispense:  90 tablet    Refill:  3    Signed, Richardson Dopp, PA-C  01/19/2021 10:26 AM    [facility] [address], [place], [place]  [address] Phone: [phone]; Fax: [phone]

## 2021-01-19 ENCOUNTER — Other Ambulatory Visit: Payer: Self-pay

## 2021-01-19 ENCOUNTER — Encounter: Payer: Self-pay | Admitting: Physician Assistant

## 2021-01-19 ENCOUNTER — Ambulatory Visit (INDEPENDENT_AMBULATORY_CARE_PROVIDER_SITE_OTHER): Payer: Commercial Managed Care - PPO | Admitting: Physician Assistant

## 2021-01-19 VITALS — BP 170/70 | HR 66 | Ht 70.0 in | Wt 197.0 lb

## 2021-01-19 DIAGNOSIS — I10 Essential (primary) hypertension: Secondary | ICD-10-CM | POA: Diagnosis not present

## 2021-01-19 DIAGNOSIS — I2510 Atherosclerotic heart disease of native coronary artery without angina pectoris: Secondary | ICD-10-CM | POA: Diagnosis not present

## 2021-01-19 DIAGNOSIS — I872 Venous insufficiency (chronic) (peripheral): Secondary | ICD-10-CM

## 2021-01-19 DIAGNOSIS — Z720 Tobacco use: Secondary | ICD-10-CM

## 2021-01-19 DIAGNOSIS — E785 Hyperlipidemia, unspecified: Secondary | ICD-10-CM | POA: Diagnosis not present

## 2021-01-19 MED ORDER — AMLODIPINE BESYLATE 10 MG PO TABS: 10.0000 mg | ORAL_TABLET | Freq: Every day | ORAL | 1 refills | Status: AC

## 2021-01-19 MED ORDER — VALSARTAN 80 MG PO TABS: 80.0000 mg | ORAL_TABLET | Freq: Every day | ORAL | 1 refills | Status: DC

## 2021-01-19 MED ORDER — ATORVASTATIN CALCIUM 20 MG PO TABS: 20.0000 mg | ORAL_TABLET | Freq: Every day | ORAL | 3 refills | Status: AC

## 2021-01-19 MED ORDER — HYDROCHLOROTHIAZIDE 25 MG PO TABS: ORAL_TABLET | ORAL | 3 refills | Status: AC

## 2021-01-19 MED ORDER — EZETIMIBE 10 MG PO TABS: 10.0000 mg | ORAL_TABLET | Freq: Every day | ORAL | 3 refills | Status: AC

## 2021-01-19 MED ORDER — CLOPIDOGREL BISULFATE 75 MG PO TABS: 75.0000 mg | ORAL_TABLET | Freq: Every day | ORAL | 3 refills | Status: DC

## 2021-01-19 NOTE — Patient Instructions (Addendum)
Medication Instructions:  Your physician has recommended you make the following change in your medication:  1.  START Valsaran 80 mg taking 1 tablet daily   *If you need a refill on your cardiac medications before your next appointment, please call your pharmacy*   Lab Work: None ordered  If you have labs (blood work) drawn today and your tests are completely normal, you will receive your results only by: Marland Kitchen MyChart Message (if you have MyChart) OR . A paper copy in the mail If you have any lab test that is abnormal or we need to change your treatment, we will call you to review the results.   Testing/Procedures: None ordered   Follow-Up: At [facility], you and your health needs are our priority.  As part of our continuing mission to provide you with exceptional heart care, we have created designated Provider Care Teams.  These Care Teams include your primary Cardiologist (physician) and Advanced Practice Providers (APPs -  Physician Assistants and Nurse Practitioners) who all work together to provide you with the care you need, when you need it.  We recommend signing up for the patient portal called "MyChart".  Sign up information is provided on this After Visit Summary.  MyChart is used to connect with patients for Virtual Visits (Telemedicine).  Patients are able to view lab/test results, encounter notes, upcoming appointments, etc.  Non-urgent messages can be sent to your provider as well.   To learn more about what you can do with MyChart, go to [email or web address].    Your next appointment:   2 WEEKS   02/03/2021  The format for your next appointment:   In Person  Provider:   You may see Jenkins Rouge, MD or one of the following Advanced Practice Providers on your designated Care Team:    [facility], PA-C    Other Instructions kEEP YOUR LEGS ELEVATED WHEN SEATED AND TRY TO [place]

## 2021-01-19 NOTE — Addendum Note (Signed)
Addended by: Gaetano Net on: 01/19/2021 10:30 AM   Modules accepted: Orders

## 2021-01-20 ENCOUNTER — Other Ambulatory Visit: Payer: Self-pay | Admitting: Internal Medicine

## 2021-01-20 DIAGNOSIS — E559 Vitamin D deficiency, unspecified: Secondary | ICD-10-CM

## 2021-02-03 ENCOUNTER — Ambulatory Visit: Payer: Commercial Managed Care - PPO | Admitting: Physician Assistant

## 2021-02-08 ENCOUNTER — Ambulatory Visit (INDEPENDENT_AMBULATORY_CARE_PROVIDER_SITE_OTHER): Payer: Commercial Managed Care - PPO | Admitting: Pharmacy Technician

## 2021-02-08 ENCOUNTER — Other Ambulatory Visit: Payer: Commercial Managed Care - PPO | Admitting: *Deleted

## 2021-02-08 ENCOUNTER — Other Ambulatory Visit: Payer: Self-pay

## 2021-02-08 VITALS — BP 142/98 | HR 66

## 2021-02-08 DIAGNOSIS — I10 Essential (primary) hypertension: Secondary | ICD-10-CM | POA: Diagnosis not present

## 2021-02-08 LAB — BASIC METABOLIC PANEL
BUN/Creatinine Ratio: 14 (ref 9–20)
BUN: 15 mg/dL (ref 6–24)
CO2: 24 mmol/L (ref 20–29)
Calcium: 9.3 mg/dL (ref 8.7–10.2)
Chloride: 102 mmol/L (ref 96–106)
Creatinine, Ser: 1.04 mg/dL (ref 0.76–1.27)
Glucose: 97 mg/dL (ref 65–99)
Potassium: 3.7 mmol/L (ref 3.5–5.2)
Sodium: 141 mmol/L (ref 134–144)
eGFR: 83 mL/min/{1.73_m2} (ref >59)

## 2021-02-08 NOTE — Progress Notes (Signed)
Patient ID: Lucas Norton                 DOB: May 27, 1961                      MRN: [id number]     HPI: Lucas Norton is a 60 y.o. male referred by Lucas Dopp, PA-C to HTN clinic. PMH is significant for CAD (STEMI in 04/2017, NSTEMI 12/201) s/p DES, HTN, HLD, tobacco abuse. Patient was last seen by Lucas Dopp, PA-C on 01/19/2021 and blood pressure was 170/70, then 160/110 mmHg on repeat with HR 66. Patient was prescribed valsartan 80 mg daily and referred to hypertension clinic for further management. He is not on beta blocker therapy secondary to bradycardia.  Patient presents to hypertension clinic for initial visit. Patient endorsed tolerating valsartan without issue but has been unable to record blood pressure at home due to recent traveling. Patient did endorse lower extremity swelling that has been getting worse over the last few weeks. Unclear if this could be due to his amlodipine or another cause; has been on 10mg  of amlodipine since Nov 2021. Patient endorses a low salt diet but little exercise. He plans to start an exercise regimen [place]. Patient is still smoking every day but is considering quitting as he has been able to in the past.  Current HTN meds: amlodipine 10 mg daily, HCTZ 25 mg daily, valsartan 80 mg daily   Previously tried:  lisinopril (cough) losartan (cough) metoprolol tartrate 12.5 mg BID (bradycardia) Imdur  BP goal: <130/80 mmHg  Family History: hypertension (father), heart disease (mother)   Social History: current every day smoker (previously quit)  Diet: endorses low salt diet with a variety of meat + vegetables  Exercise: [place] beach; plans to start exercising   Home BP readings: none (traveling the last few weeks)  Wt Readings from Last 3 Encounters:  01/19/21 197 lb (89.4 kg)  12/29/20 195 lb (88.5 kg)  12/10/20 198 lb (89.8 kg)   BP Readings from Last 3 Encounters:  01/19/21 (!) 170/70   12/29/20 132/82  12/10/20 120/79   Pulse Readings from Last 3 Encounters:  01/19/21 66  12/29/20 86  12/10/20 70    Renal function: CrCl cannot be calculated (Patient's most recent lab result is older than the maximum 21 days allowed.).  Past Medical History:  Diagnosis Date  . Back pain   . CAD in native artery    a. NSTEMI 04/2017 s/p DES to mLAD and Cx, PTCA to D2, EF 50-55%.  Marland Kitchen HLD (hyperlipidemia)   . Hypertension   . NSTEMI (non-ST elevated myocardial infarction) ([place]) 05/21/2017   05/21/17 PCI/DES to mLAD, mLcx. EF normal  . Sinus bradycardia    unable to titrate BB further due to this  . Tobacco abuse     Current Outpatient Medications on File Prior to Visit  Medication Sig Dispense Refill  . amLODipine (NORVASC) 10 MG tablet Take 1 tablet (10 mg total) by mouth daily. 90 tablet 1  . ASPIRIN 81 PO Take 1 tablet by mouth daily.    Marland Kitchen atorvastatin (LIPITOR) 20 MG tablet Take 1 tablet (20 mg total) by mouth daily. 90 tablet 3  . clopidogrel (PLAVIX) 75 MG tablet Take 1 tablet (75 mg total) by mouth daily. 90 tablet 3  . cyanocobalamin (,VITAMIN B-12,) 1000 MCG/ML injection Inject 76ml into the muscle once a week for 4 weeks, then  once a month thereafter 6 mL 1  . ezetimibe (ZETIA) 10 MG tablet Take 1 tablet (10 mg total) by mouth daily. 90 tablet 3  . hydrochlorothiazide (HYDRODIURIL) 25 MG tablet One tablet daily 90 tablet 3  . sildenafil (VIAGRA) 50 MG tablet Take 1 tablet (50 mg total) by mouth daily as needed for erectile dysfunction. 10 tablet 0  . SYRINGE-NEEDLE, DISP, 3 ML (BD ECLIPSE SYRINGE) 25G X 5/8" 3 ML MISC Use as directed 50 each 0  . valsartan (DIOVAN) 80 MG tablet Take 1 tablet (80 mg total) by mouth daily. 30 tablet 1  . Vitamin D, Ergocalciferol, (DRISDOL) 1.25 MG (50000 UNIT) CAPS capsule TAKE 1 CAPSULE (50,000 UNITS TOTAL) BY MOUTH EVERY 7 (SEVEN) DAYS FOR 12 DOSES. 12 capsule 0   No current facility-administered medications on file prior to visit.     Allergies  Allergen Reactions  . Brilinta [Ticagrelor] Shortness Of Breath  . Lisinopril Cough     Assessment/Plan:  1. Hypertension - Patient's blood pressure remains above goal <130/76mmHg on amlodipine 10 mg daily, HCTZ 25 mg daily, and valsartan 80 mg daily. Encouraged patient to record blood pressure and heart rate at least 2-3 times a week. For lower extremity swelling, patient was encouraged to monitor, call with any worsening, and try compression socks. We will follow up with patient when labs result and again in a few weeks to see trend in blood pressure, heart rate, and lower extremity swelling. We will decide on further dose adjustments of medications at that time as pt has been unable to Norton home BP readings recently. Patient will need to establish medical care [place] but noted he would be able to come to clinic if further appointments were needed for the next few months.   Romilda Garret, PharmD PGY1 Acute Care Pharmacy Resident 02/08/2021 10:18 AM

## 2021-02-08 NOTE — Patient Instructions (Addendum)
It was great meeting you today! Your blood pressure is still above your goal <130/80. You had labs drawn today to check your renal function and electrolytes. We will call you with these results.   Please check your blood pressure a few times per week and write down both the blood pressure and the heart rate. We will call back in a few weeks to see how your blood pressure is prior to making any medication changes.

## 2021-02-09 ENCOUNTER — Encounter: Payer: Self-pay | Admitting: Gastroenterology

## 2021-02-09 ENCOUNTER — Ambulatory Visit (INDEPENDENT_AMBULATORY_CARE_PROVIDER_SITE_OTHER): Payer: Commercial Managed Care - PPO | Admitting: Gastroenterology

## 2021-02-09 VITALS — BP 137/80 | HR 64 | Ht 70.0 in | Wt 198.4 lb

## 2021-02-09 DIAGNOSIS — K641 Second degree hemorrhoids: Secondary | ICD-10-CM | POA: Diagnosis not present

## 2021-02-09 NOTE — Progress Notes (Signed)
60 y/o male here for a follow up visit for hemorrhoid banding.  He had a colonoscopy with me for rectal bleeding in February, had some benign hyperplastic polyps removed and hemorrhoids with stigmata of recent bleeding.  He has had chronic rectal bleeding intermittently for years.  He also has pruritus and irritation as well as grade 2 prolapse.  He is on chronic Plavix, we had discussed risk benefits of hemorrhoid banding on the regimen and he wished to proceed with it.  We banded the left lateral hemorrhoid on March 8.  He states he has had improvement since the first banding.  He initially had no bleeding for a few weeks but it has since recurred a milder level than previous.  Had some slight discomfort after the first band which then abated with time.  He wishes to continue with additional banding today given his ongoing symptoms.  Colonoscopy 12/10/20  - Hemorrhoids were found on perianal exam with stigmata of recent bleeding. - Many medium-mouthed diverticula were found in the transverse colon and left colon. Superficial erythematous changes noted in the sigmoid colon due to diverticulosis. - Two sessile polyps were found in the recto-sigmoid colon and sigmoid colon. The polyps were 3 to 4 mm in size. These polyps were removed with a cold snare. Resection and retrieval were complete. - Internal hemorrhoids were found during retroflexion. - There was spasm in the entire colon, and along with the patient having coughing throughout the exam (managed per anesthesia). - The exam was otherwise without abnormality.  Diagnosis Surgical [P], colon, sigmoid x 1, rectosigmoid x 1, polyp (2) - HYPERPLASTIC POLYP(S)   PROCEDURE NOTE: The patient presents with symptomatic grade II  hemorrhoids, requesting rubber band ligation of his/her hemorrhoidal disease. All risks, benefits and alternative forms of therapy were described and informed consent was obtained.  The anorectum was pre-medicated with  0.125% nitroglycerin The decision was made to band the RP internal hemorrhoid, and the [place] was used to perform band ligation without complication. Digital anorectal examination was then performed to assure proper positioning of the band, and to adjust the banded tissue as required.  The patient was discharged home without pain or other issues. Dietary and behavioral recommendations were given and along with follow-up instructions.    The following adjunctive treatments were recommended: Avoid straining, no heavy lifting  The patient will return in 2-4 weeks for  follow-up and possible additional banding as required. No complications were encountered and the patient tolerated the procedure well.  Quinn Cellar, MD [facility]

## 2021-02-09 NOTE — Patient Instructions (Addendum)
If you are age 60 or older, your body mass index should be between 23-30. Your Body mass index is 28.47 kg/m. If this is out of the aforementioned range listed, please consider follow up with your Primary Care Provider.  If you are age 64 or younger, your body mass index should be between 19-25. Your Body mass index is 28.47 kg/m. If this is out of the aformentioned range listed, please consider follow up with your Primary Care Provider.   HEMORRHOID BANDING PROCEDURE    FOLLOW-UP CARE   1. The procedure you have had should have been relatively painless since the banding of the area involved does not have nerve endings and there is no pain sensation.  The rubber band cuts off the blood supply to the hemorrhoid and the band may fall off as soon as 48 hours after the banding (the band may occasionally be seen in the toilet bowl following a bowel movement). You may notice a temporary feeling of fullness in the rectum which should respond adequately to plain Tylenol or Motrin.  2. Following the banding, avoid strenuous exercise that evening and resume full activity the next day.  A sitz bath (soaking in a warm tub) or bidet is soothing, and can be useful for cleansing the area after bowel movements.     3. To avoid constipation, take two tablespoons of natural wheat bran, natural oat bran, flax, Benefiber or any over the counter fiber supplement and increase your water intake to 7-8 glasses daily.    4. Unless you have been prescribed anorectal medication, do not put anything inside your rectum for two weeks: No suppositories, enemas, fingers, etc.  5. Occasionally, you may have more bleeding than usual after the banding procedure.  This is often from the untreated hemorrhoids rather than the treated one.  Don't be concerned if there is a tablespoon or so of blood.  If there is more blood than this, lie flat with your bottom higher than your head and apply an ice pack to the area. If the bleeding  does not stop within a half an hour or if you feel faint, call our office at [phone] or go to the emergency room.  6. Problems are not common; however, if there is a substantial amount of bleeding, severe pain, chills, fever or difficulty passing urine (very rare) or other problems, you should call us at [phone] or report to the nearest emergency room.  7. Do not stay seated continuously for more than 2-3 hours for a day or two after the procedure.  Tighten your buttock muscles 10-15 times every two hours and take 10-15 deep breaths every 1-2 hours.  Do not spend more than a few minutes on the toilet if you cannot empty your bowel; instead re-visit the toilet at a later time.    You are scheduled for your 3rd banding appointment on Thursday, 02-25-2021 at 3:40pm.   Thank you for entrusting me with your care and for choosing [facility], Dr. [place] Cellar

## 2021-02-10 ENCOUNTER — Other Ambulatory Visit: Payer: Self-pay | Admitting: Physician Assistant

## 2021-02-22 ENCOUNTER — Encounter: Payer: Self-pay | Admitting: Gastroenterology

## 2021-02-22 ENCOUNTER — Ambulatory Visit (INDEPENDENT_AMBULATORY_CARE_PROVIDER_SITE_OTHER): Payer: Commercial Managed Care - PPO | Admitting: Gastroenterology

## 2021-02-22 VITALS — BP 110/82 | HR 64 | Ht 70.0 in | Wt 194.0 lb

## 2021-02-22 DIAGNOSIS — K641 Second degree hemorrhoids: Secondary | ICD-10-CM

## 2021-02-22 NOTE — Progress Notes (Signed)
60 y/o male here for a follow up visit for hemorrhoid banding.He had a colonoscopy with me for rectal bleeding in February, had some benign hyperplastic polyps removed and hemorrhoids with stigmata of recent bleeding. He has had chronic rectal bleeding intermittently for years. He also has pruritus and irritation as well as grade 2 prolapse.He is on chronic Plavix, we had discussed risk benefits of hemorrhoid banding on the regimen and he wished to proceed with it.  We banded the left lateral hemorrhoid on March 8 and then the right posterior hemorrhoid on April 19th.  He states he has had improvement since the first banding. Bleeding, irritation, overall better since banding. He wishes to do final banding today. He remains on Plavix, tolerated first two bandings well without bleeding.     Colonoscopy 12/10/20 - Hemorrhoids were found on perianal exam with stigmata of recent bleeding. - Many medium-mouthed diverticula were found in the transverse colon and left colon. Superficial erythematous changes noted in the sigmoid colon due to diverticulosis. - Two sessile polyps were found in the recto-sigmoid colon and sigmoid colon. The polyps were 3 to 4 mm in size. These polyps were removed with a cold snare. Resection and retrieval were complete. - Internal hemorrhoids were found during retroflexion. - There was spasm in the entire colon, and along with the patient having coughing throughout the exam (managed per anesthesia). - The exam was otherwise without abnormality.  Diagnosis Surgical [P], colon, sigmoid x 1, rectosigmoid x 1, polyp (2) - HYPERPLASTIC POLYP(S)   PROCEDURE NOTE: The patient presents with symptomatic gradeIIhemorrhoids, requesting rubber band ligation of his/her hemorrhoidal disease.All risks, benefits and alternative forms of therapy were described and informed consent was obtained.  The anorectum was pre-medicated with0.125% nitroglycerin The decision was  made to band theRAinternal hemorrhoid, and the [place] was used to perform band ligation without complication. Digital anorectal examination was then performed to assure proper positioning of the band, and to adjust the banded tissue as required. The patient was discharged home without pain or other issues.Dietary and behavioral recommendations were given and along with follow-up instructions.  The following adjunctive treatments were recommended: Avoid straining, no heavy lifting  The patient will return as neededfor follow-up and possible additional banding as required. No complications were encountered and the patient tolerated the procedure well.  Daniels Cellar, MD [facility]

## 2021-02-25 ENCOUNTER — Encounter: Payer: Commercial Managed Care - PPO | Admitting: Gastroenterology

## 2021-03-01 ENCOUNTER — Telehealth: Payer: Self-pay | Admitting: Pharmacy Technician

## 2021-03-01 NOTE — Telephone Encounter (Signed)
Attempted to follow up with patient to assess home blood pressure readings and his lower extremity swelling.  At last visit with hypertension clinic, patient's blood pressure was slightly above goal of <130/80 mmHg without any home blood pressure readings to trend. Patient was continued on amlodipine 10 mg daily, HCTZ 25 mg daily, and valsartan 80 mg daily and asked to record his blood pressure and heart rate at least 2-3 times a week. Patient also endorsed lower extremity swelling and was encouraged to try compression socks and monitor at home. Of note, [place].  Left VM to call back.

## 2021-04-18 ENCOUNTER — Other Ambulatory Visit: Payer: Self-pay | Admitting: Internal Medicine

## 2021-04-18 DIAGNOSIS — E559 Vitamin D deficiency, unspecified: Secondary | ICD-10-CM

## 2021-04-28 ENCOUNTER — Other Ambulatory Visit: Payer: Self-pay | Admitting: Internal Medicine

## 2022-01-26 ENCOUNTER — Other Ambulatory Visit: Payer: Self-pay | Admitting: Physician Assistant

## 2022-04-25 ENCOUNTER — Other Ambulatory Visit: Payer: Self-pay | Admitting: Cardiovascular Disease
